# Patient Record
Sex: Male | Born: 1975 | Race: Black or African American | Hispanic: No | Marital: Single | Smoking: Former smoker
Health system: Southern US, Community
[De-identification: ages and names within clinical notes are randomized; demographics above are authoritative.]

## PROBLEM LIST (undated history)

## (undated) DIAGNOSIS — Z789 Other specified health status: Secondary | ICD-10-CM

---

## 2000-07-04 ENCOUNTER — Emergency Department (HOSPITAL_COMMUNITY): Admission: EM | Admit: 2000-07-04 | Discharge: 2000-07-04 | Payer: Self-pay | Admitting: Emergency Medicine

## 2000-07-05 ENCOUNTER — Encounter: Payer: Self-pay | Admitting: Emergency Medicine

## 2000-07-05 ENCOUNTER — Emergency Department (HOSPITAL_COMMUNITY): Admission: EM | Admit: 2000-07-05 | Discharge: 2000-07-05 | Payer: Self-pay | Admitting: Emergency Medicine

## 2000-07-12 ENCOUNTER — Emergency Department (HOSPITAL_COMMUNITY): Admission: EM | Admit: 2000-07-12 | Discharge: 2000-07-12 | Payer: Self-pay | Admitting: *Deleted

## 2006-02-05 ENCOUNTER — Emergency Department (HOSPITAL_COMMUNITY): Admission: EM | Admit: 2006-02-05 | Discharge: 2006-02-05 | Payer: Self-pay | Admitting: Emergency Medicine

## 2013-08-03 ENCOUNTER — Emergency Department (HOSPITAL_COMMUNITY)
Admission: EM | Admit: 2013-08-03 | Discharge: 2013-08-03 | Disposition: A | Discharge status: Home / Self Care | Payer: Self-pay | Attending: Emergency Medicine | Admitting: Emergency Medicine

## 2013-08-03 ENCOUNTER — Encounter (HOSPITAL_COMMUNITY): Payer: Self-pay | Admitting: Emergency Medicine

## 2013-08-03 DIAGNOSIS — F17200 Nicotine dependence, unspecified, uncomplicated: Secondary | ICD-10-CM | POA: Insufficient documentation

## 2013-08-03 DIAGNOSIS — M545 Low back pain, unspecified: Secondary | ICD-10-CM | POA: Insufficient documentation

## 2013-08-03 MED ORDER — CYCLOBENZAPRINE HCL 10 MG PO TABS: 10.0000 mg | ORAL_TABLET | Freq: Three times a day (TID) | ORAL | Status: DC | PRN

## 2013-08-03 NOTE — ED Notes (Signed)
Per pt sts lower back pain for a few days since doing some work. sts taking aleve with some relief.

## 2013-08-03 NOTE — ED Provider Notes (Signed)
CSN: [id number]     Arrival date & time 08/03/13  1217 History  This chart was scribed for non-physician practitioner. Trixie Dredge, PA-C, working with Laray Anger, DO by Shari Heritage, ED Scribe. This patient was seen in room TR06C/TR06C and the patient's care was started at 2:17 PM.    Chief Complaint  Patient presents with  . Back Pain    The history is provided by the patient. No language interpreter was used.     HPI Comments: Nathan Mann is a 37 y.o. male who presents to the Emergency Department complaining of constant lower back pain onset 4 days ago. Pain is exacerbated with certain movements of his legs. Currently, he rates pain as 9/10. Patient states that he was sitting with his back bent in a chair doing spackling work on his baseboards at home when his back "locked up" and he developed pain. He has been taking 2 ibuprofen every 4 hours. He denies numbness or weakness of the extremities, abdominal pain, fever, dysuria, hematuria, urinary incontinence, bowel incontinence, diarrhea, or constipation. He denies a history of cancer. He has no history of IV drug use.    History reviewed. No pertinent past medical history. History reviewed. No pertinent past surgical history. History reviewed. No pertinent family history. History  Substance Use Topics  . Smoking status: Current Every Day Smoker  . Smokeless tobacco: Not on file  . Alcohol Use: No    Review of Systems  Constitutional: Negative for fever.  Gastrointestinal: Negative for abdominal pain, diarrhea and constipation.  Genitourinary: Negative for dysuria, hematuria and difficulty urinating.  Musculoskeletal: Positive for back pain.  Neurological: Negative for weakness and numbness.    Allergies  Review of patient's allergies indicates no known allergies.  Home Medications   Current Outpatient Rx  Name  Route  Sig  Dispense  Refill  . ibuprofen (ADVIL,MOTRIN) 200 MG tablet   Oral   Take 400 mg by  mouth every 6 (six) hours as needed for moderate pain.         . Menthol, Topical Analgesic, (ICY HOT EX)   Apply externally   Apply 1 application topically daily as needed (back pain).          Triage Vitals: BP 137/94  Pulse 96  Temp(Src) 97.9 F (36.6 C) (Oral)  Resp 20  Ht 6\' 2"  (1.88 m)  Wt 230 lb 12.8 oz (104.69 kg)  BMI 29.62 kg/m2  SpO2 98% Physical Exam  Nursing note and vitals reviewed. Constitutional: He appears well-developed and well-nourished. No distress.  HENT:  Head: Normocephalic and atraumatic.  Neck: Neck supple.  Pulmonary/Chest: Effort normal.  Abdominal: Soft. There is no tenderness. There is no rebound and no guarding.  Musculoskeletal:  Spine nontender, no crepitus, or stepoffs. Back nontender. Lower extremities:  Strength 5/5, sensation intact, distal pulses intact.    Neurological: He is alert.  Skin: He is not diaphoretic.    ED Course  Procedures (including critical care time) DIAGNOSTIC STUDIES: Oxygen Saturation is 98% on room air, normal by my interpretation.    COORDINATION OF CARE: 2:20 PM- Patient informed of current plan for treatment and evaluation and agrees with plan at this time.      MDM   1. Low back pain    Pt with persistent low back pain after it "locked up" while he was leaning over in a chair for prolonged period.  Neurovascularly intact.  No red flags.  D/C home with flexeril, continued NSAIDs.  Discussed  findings, treatment, and follow up  with patient.  Pt given return precautions.  Pt verbalizes understanding and agrees with plan.      I personally performed the services described in this documentation, which was scribed in my presence. The recorded information has been reviewed and is accurate.    Trixie Dredge, PA-C 08/03/13 1519

## 2013-08-04 NOTE — ED Provider Notes (Signed)
Medical screening examination/treatment/procedure(s) were performed by non-physician practitioner and as supervising physician I was immediately available for consultation/collaboration.  EKG Interpretation   None         Tin Engram M Malone Vanblarcom, DO 08/04/13 1040 

## 2015-06-26 ENCOUNTER — Encounter (HOSPITAL_COMMUNITY): Payer: Self-pay | Admitting: Emergency Medicine

## 2015-06-26 ENCOUNTER — Emergency Department (HOSPITAL_COMMUNITY)
Admission: EM | Admit: 2015-06-26 | Discharge: 2015-06-27 | Disposition: A | Discharge status: Home / Self Care | Payer: Medicaid Other | Attending: Emergency Medicine | Admitting: Emergency Medicine

## 2015-06-26 DIAGNOSIS — H6502 Acute serous otitis media, left ear: Secondary | ICD-10-CM | POA: Diagnosis not present

## 2015-06-26 DIAGNOSIS — Z720 Tobacco use: Secondary | ICD-10-CM | POA: Diagnosis not present

## 2015-06-26 DIAGNOSIS — H9202 Otalgia, left ear: Secondary | ICD-10-CM | POA: Diagnosis present

## 2015-06-26 DIAGNOSIS — H6092 Unspecified otitis externa, left ear: Secondary | ICD-10-CM | POA: Insufficient documentation

## 2015-06-26 DIAGNOSIS — J029 Acute pharyngitis, unspecified: Secondary | ICD-10-CM | POA: Insufficient documentation

## 2015-06-26 NOTE — ED Notes (Addendum)
Patient with  Left ear pain that started suddenly this evening.  He states that he woke up with it, it is a stabbing sensation.  The pain radiates into his head.

## 2015-06-27 MED ORDER — AMOXICILLIN 500 MG PO CAPS
500.0000 mg | ORAL_CAPSULE | Freq: Three times a day (TID) | ORAL | Status: DC
Start: 2015-06-27 — End: 2016-08-22

## 2015-06-27 MED ORDER — NEOMYCIN-POLYMYXIN-HC 1 % OT SOLN
4.0000 [drp] | Freq: Once | OTIC | Status: AC
  Administered 2015-06-27: 4 [drp] via OTIC
  Filled 2015-06-27: qty 10

## 2015-06-27 MED ORDER — HYDROCODONE-ACETAMINOPHEN 5-325 MG PO TABS: 1.0000 | ORAL_TABLET | Freq: Four times a day (QID) | ORAL | Status: DC | PRN

## 2015-06-27 MED ORDER — PSEUDOEPHEDRINE HCL 30 MG PO TABS: 30.0000 mg | ORAL_TABLET | Freq: Four times a day (QID) | ORAL | Status: DC | PRN

## 2015-06-27 MED ORDER — OXYCODONE-ACETAMINOPHEN 5-325 MG PO TABS
1.0000 | ORAL_TABLET | Freq: Once | ORAL | Status: AC
  Administered 2015-06-27: 1 via ORAL
  Filled 2015-06-27: qty 1

## 2015-06-27 MED ORDER — AMOXICILLIN 500 MG PO CAPS
500.0000 mg | ORAL_CAPSULE | Freq: Once | ORAL | Status: AC
  Administered 2015-06-27: 500 mg via ORAL
  Filled 2015-06-27: qty 1

## 2015-06-27 NOTE — Discharge Instructions (Signed)
Otitis Externa Otitis externa is a germ infection in the outer ear. The outer ear is the area from the eardrum to the outside of the ear. Otitis externa is sometimes called "swimmer's ear." HOME CARE  Put drops in the ear as told by your doctor.  Only take medicine as told by your doctor.  If you have diabetes, your doctor may give you more directions. Follow your doctor's directions.  Keep all doctor visits as told. To avoid another infection:  Keep your ear dry. Use the corner of a towel to dry your ear after swimming or bathing.  Avoid scratching or putting things inside your ear.  Avoid swimming in lakes, dirty water, or pools that use a chemical called chlorine poorly.  You may use ear drops after swimming. Combine equal amounts of white vinegar and alcohol in a bottle. Put 3 or 4 drops in each ear. GET HELP IF:   You have a fever.  Your ear is still red, puffy (swollen), or painful after 3 days.  You still have yellowish-white fluid (pus) coming from the ear after 3 days.  Your redness, puffiness, or pain gets worse.  You have a really bad headache.  You have redness, puffiness, pain, or tenderness behind your ear. MAKE SURE YOU:   Understand these instructions.  Will watch your condition.  Will get help right away if you are not doing well or get worse.   This information is not intended to replace advice given to you by your health care provider. Make sure you discuss any questions you have with your health care provider.   Document Released: 02/16/2008 Document Revised: 09/20/2014 Document Reviewed: 09/16/2011 Elsevier Interactive Patient Education 2016 [employer]  Otitis Media, Adult Otitis media is redness, soreness, and puffiness (swelling) in the space just behind your eardrum (middle ear). It may be caused by allergies or infection. It often happens along with a cold. HOME CARE  Take your medicine as told. Finish it even if you start to feel  better.  Only take over-the-counter or prescription medicines for pain, discomfort, or fever as told by your doctor.  Follow up with your doctor as told. GET HELP IF:  You have otitis media only in one ear, or bleeding from your nose, or both.  You notice a lump on your neck.  You are not getting better in 3-5 days.  You feel worse instead of better. GET HELP RIGHT AWAY IF:   You have pain that is not helped with medicine.  You have puffiness, redness, or pain around your ear.  You get a stiff neck.  You cannot move part of your face (paralysis).  You notice that the bone behind your ear hurts when you touch it. MAKE SURE YOU:   Understand these instructions.  Will watch your condition.  Will get help right away if you are not doing well or get worse.   This information is not intended to replace advice given to you by your health care provider. Make sure you discuss any questions you have with your health care provider.   Document Released: 02/16/2008 Document Revised: 09/20/2014 Document Reviewed: 03/27/2013 Elsevier Interactive Patient Education [employer] Elsevier Inc.

## 2015-06-27 NOTE — ED Provider Notes (Signed)
CSN: [id number]     Arrival date & time 06/26/15  2350 History   First MD Initiated Contact with Patient 06/27/15 0002     Chief Complaint  Patient presents with  . Otalgia     (Consider location/radiation/quality/duration/timing/severity/associated sxs/prior Treatment) Patient is a 39 y.o. male presenting with ear pain. The history is provided by the patient.  Otalgia Location:  Left Quality:  Sharp Severity:  Moderate Onset quality:  Gradual Duration:  2 hours Timing:  Constant Progression:  Worsening Chronicity:  New Relieved by:  None tried Worsened by:  Nothing tried Ineffective treatments:  None tried Associated symptoms: congestion and sore throat     History reviewed. No pertinent past medical history. History reviewed. No pertinent past surgical history. History reviewed. No pertinent family history. Social History  Substance Use Topics  . Smoking status: Current Every Day Smoker  . Smokeless tobacco: None  . Alcohol Use: No    Review of Systems  HENT: Positive for congestion, ear pain and sore throat.   all other systems negative    Allergies  Review of patient's allergies indicates no known allergies.  Home Medications   Prior to Admission medications   Medication Sig Start Date End Date Taking? Authorizing Provider  amoxicillin (AMOXIL) 500 MG capsule Take 1 capsule (500 mg total) by mouth 3 (three) times daily. 06/27/15   Hope Orlene OchM Neese, NP  cyclobenzaprine (FLEXERIL) 10 MG tablet Take 1 tablet (10 mg total) by mouth 3 (three) times daily as needed for muscle spasms. 08/03/13   Trixie DredgeEmily West, PA-C  HYDROcodone-acetaminophen (NORCO) 5-325 MG tablet Take 1 tablet by mouth every 6 (six) hours as needed. 06/27/15   Hope Orlene OchM Neese, NP  ibuprofen (ADVIL,MOTRIN) 200 MG tablet Take 400 mg by mouth every 6 (six) hours as needed for moderate pain.    Historical Provider, MD  Menthol, Topical Analgesic, (ICY HOT EX) Apply 1 application topically daily as needed (back  pain).    Historical Provider, MD  pseudoephedrine (SUDAFED) 30 MG tablet Take 1 tablet (30 mg total) by mouth every 6 (six) hours as needed for congestion. 06/27/15   Hope Orlene OchM Neese, NP   BP 146/95 mmHg  Pulse 66  Temp(Src) 97.7 F (36.5 C) (Oral)  Resp 18  SpO2 95% Physical Exam  Constitutional: He is oriented to person, place, and time. He appears well-developed and well-nourished. No distress.  HENT:  Head: Normocephalic.  Left Ear: There is swelling and tenderness. No mastoid tenderness. Tympanic membrane is erythematous and bulging.  Nose: Rhinorrhea present.  Mouth/Throat: Uvula is midline and mucous membranes are normal. Posterior oropharyngeal erythema present.  Eyes: Conjunctivae and EOM are normal. Pupils are equal, round, and reactive to light.  Neck: Normal range of motion. Neck supple.  Cardiovascular: Normal rate and regular rhythm.   Pulmonary/Chest: Effort normal and breath sounds normal.  Musculoskeletal: Normal range of motion.  Lymphadenopathy:    He has cervical adenopathy.  Neurological: He is alert and oriented to person, place, and time. No cranial nerve deficit.  Skin: Skin is warm and dry.  Psychiatric: He has a normal mood and affect. His behavior is normal.  Nursing note and vitals reviewed.   ED Course  Procedures  Cortisporin Otic Suspension left ear, Pain management, Antibiotics  Discussed with the patient elevated BP he states he saw his doctor recently and was told it was elevated and started on medication but he is not taking it. Encouraged patient to take his medication.  BP 146/95  mmHg  Pulse 66  Temp(Src) 97.7 F (36.5 C) (Oral)  Resp 18  SpO2 95%   MDM  39 y.o. male with left ear pain, sore throat and congestion. Stable for discharge without fever, mastoid tenderness and does not appear toxic. Discussed with the patient clinical findings and plan of care and all questioned fully answered. He will follow up with his PCP or return here if  any problems arise.   Final diagnoses:  Otitis externa, left  Acute serous otitis media of left ear, recurrence not specified       Janne Napoleon, NP 06/27/15 0141  Tomasita Crumble, MD 06/27/15 [phone]

## 2016-08-18 HISTORY — PX: WISDOM TOOTH EXTRACTION: SHX21

## 2016-08-22 ENCOUNTER — Emergency Department (HOSPITAL_COMMUNITY): Payer: Medicaid Other | Admitting: Anesthesiology

## 2016-08-22 ENCOUNTER — Encounter (HOSPITAL_COMMUNITY)
Admission: EM | Disposition: A | Discharge status: Home / Self Care | Payer: Self-pay | Source: Home / Self Care | Attending: Emergency Medicine

## 2016-08-22 ENCOUNTER — Encounter (HOSPITAL_COMMUNITY): Payer: Self-pay | Admitting: Emergency Medicine

## 2016-08-22 ENCOUNTER — Emergency Department (HOSPITAL_COMMUNITY): Payer: Medicaid Other

## 2016-08-22 ENCOUNTER — Observation Stay (HOSPITAL_COMMUNITY)
Admission: EM | Admit: 2016-08-22 | Discharge: 2016-08-23 | Disposition: A | Discharge status: Home / Self Care | Payer: Medicaid Other | Attending: Oral Surgery | Admitting: Oral Surgery

## 2016-08-22 DIAGNOSIS — R590 Localized enlarged lymph nodes: Secondary | ICD-10-CM | POA: Diagnosis not present

## 2016-08-22 DIAGNOSIS — K1379 Other lesions of oral mucosa: Secondary | ICD-10-CM | POA: Diagnosis not present

## 2016-08-22 DIAGNOSIS — F17200 Nicotine dependence, unspecified, uncomplicated: Secondary | ICD-10-CM | POA: Insufficient documentation

## 2016-08-22 DIAGNOSIS — K122 Cellulitis and abscess of mouth: Secondary | ICD-10-CM | POA: Diagnosis present

## 2016-08-22 HISTORY — PX: INCISION AND DRAINAGE ABSCESS: SHX5864

## 2016-08-22 HISTORY — PX: INCISE AND DRAIN ABCESS: PRO64

## 2016-08-22 HISTORY — DX: Other specified health status: Z78.9

## 2016-08-22 LAB — BASIC METABOLIC PANEL
ANION GAP: 15 (ref 5–15)
BUN: 11 mg/dL (ref 6–20)
CALCIUM: 9.4 mg/dL (ref 8.9–10.3)
CO2: 16 mmol/L — AB (ref 22–32)
CREATININE: 1.31 mg/dL — AB (ref 0.61–1.24)
Chloride: 103 mmol/L (ref 101–111)
GFR calc Af Amer: >60 mL/min (ref >60)
GLUCOSE: 110 mg/dL — AB (ref 65–99)
Potassium: 3.1 mmol/L — ABNORMAL LOW (ref 3.5–5.1)
Sodium: 134 mmol/L — ABNORMAL LOW (ref 135–145)

## 2016-08-22 LAB — CBC WITH DIFFERENTIAL/PLATELET
BASOS ABS: 0 10*3/uL (ref 0.0–0.1)
Basophils Relative: 0 %
EOS PCT: 0 %
Eosinophils Absolute: 0 10*3/uL (ref 0.0–0.7)
HEMATOCRIT: 40.6 % (ref 39.0–52.0)
Hemoglobin: 14.9 g/dL (ref 13.0–17.0)
LYMPHS PCT: 7 %
Lymphs Abs: 1.1 10*3/uL (ref 0.7–4.0)
MCH: 31.8 pg (ref 26.0–34.0)
MCHC: 36.7 g/dL — AB (ref 30.0–36.0)
MCV: 86.6 fL (ref 78.0–100.0)
MONO ABS: 1.6 10*3/uL — AB (ref 0.1–1.0)
MONOS PCT: 11 %
NEUTROS ABS: 12.1 10*3/uL — AB (ref 1.7–7.7)
Neutrophils Relative %: 81 %
PLATELETS: 221 10*3/uL (ref 150–400)
RBC: 4.69 MIL/uL (ref 4.22–5.81)
RDW: 12.6 % (ref 11.5–15.5)
WBC: 14.9 10*3/uL — ABNORMAL HIGH (ref 4.0–10.5)

## 2016-08-22 SURGERY — INCISION AND DRAINAGE, ABSCESS
Anesthesia: General | Site: Face | Laterality: Right

## 2016-08-22 MED ORDER — AMPICILLIN-SULBACTAM SODIUM 3 (2-1) G IJ SOLR
3.0000 g | Freq: Four times a day (QID) | INTRAMUSCULAR | Status: DC
  Administered 2016-08-23 (×3): 3 g via INTRAVENOUS
  Filled 2016-08-22 (×6): qty 3

## 2016-08-22 MED ORDER — HYDROMORPHONE HCL 2 MG/ML IJ SOLN
1.0000 mg | Freq: Once | INTRAMUSCULAR | Status: AC
  Administered 2016-08-22: 1 mg via INTRAVENOUS
  Filled 2016-08-22: qty 1

## 2016-08-22 MED ORDER — HYDROMORPHONE HCL 1 MG/ML IJ SOLN
INTRAMUSCULAR | Status: AC
  Filled 2016-08-22: qty 0.5

## 2016-08-22 MED ORDER — PROPOFOL 10 MG/ML IV BOLUS
INTRAVENOUS | Status: AC
Start: 2016-08-22 — End: 2016-08-22
  Filled 2016-08-22: qty 40

## 2016-08-22 MED ORDER — FENTANYL CITRATE (PF) 250 MCG/5ML IJ SOLN
INTRAMUSCULAR | Status: DC | PRN
  Administered 2016-08-22: 100 ug via INTRAVENOUS

## 2016-08-22 MED ORDER — HYDROMORPHONE HCL 2 MG/ML IJ SOLN
INTRAMUSCULAR | Status: AC
  Filled 2016-08-22: qty 1

## 2016-08-22 MED ORDER — LACTATED RINGERS IV SOLN
INTRAVENOUS | Status: DC | PRN
  Administered 2016-08-22: 23:00:00 via INTRAVENOUS

## 2016-08-22 MED ORDER — MIDAZOLAM HCL 5 MG/5ML IJ SOLN
INTRAMUSCULAR | Status: DC | PRN
  Administered 2016-08-22: 2 mg via INTRAVENOUS

## 2016-08-22 MED ORDER — LIDOCAINE HCL (CARDIAC) 20 MG/ML IV SOLN
INTRAVENOUS | Status: DC | PRN
  Administered 2016-08-22: 100 mg via INTRATRACHEAL

## 2016-08-22 MED ORDER — SUCCINYLCHOLINE CHLORIDE 20 MG/ML IJ SOLN
INTRAMUSCULAR | Status: DC | PRN
  Administered 2016-08-22: 120 mg via INTRAVENOUS

## 2016-08-22 MED ORDER — PROMETHAZINE HCL 25 MG/ML IJ SOLN: 6.2500 mg | INTRAMUSCULAR | Status: DC | PRN

## 2016-08-22 MED ORDER — ONDANSETRON HCL 4 MG/2ML IJ SOLN
4.0000 mg | Freq: Once | INTRAMUSCULAR | Status: AC
  Administered 2016-08-22: 4 mg via INTRAVENOUS
  Filled 2016-08-22: qty 2

## 2016-08-22 MED ORDER — MIDAZOLAM HCL 2 MG/2ML IJ SOLN
INTRAMUSCULAR | Status: AC
  Filled 2016-08-22: qty 2

## 2016-08-22 MED ORDER — DEXAMETHASONE SODIUM PHOSPHATE 10 MG/ML IJ SOLN
INTRAMUSCULAR | Status: DC | PRN
  Administered 2016-08-22: 10 mg via INTRAVENOUS

## 2016-08-22 MED ORDER — SODIUM CHLORIDE 0.9 % IV SOLN
Freq: Once | INTRAVENOUS | Status: AC
  Administered 2016-08-22: 21:00:00 via INTRAVENOUS

## 2016-08-22 MED ORDER — FENTANYL CITRATE (PF) 100 MCG/2ML IJ SOLN
INTRAMUSCULAR | Status: AC
  Filled 2016-08-22: qty 2

## 2016-08-22 MED ORDER — BUPIVACAINE-EPINEPHRINE (PF) 0.25% -1:200000 IJ SOLN
INTRAMUSCULAR | Status: DC | PRN
  Administered 2016-08-22: 15 mL via PERINEURAL

## 2016-08-22 MED ORDER — IOPAMIDOL (ISOVUE-300) INJECTION 61%
INTRAVENOUS | Status: AC
  Administered 2016-08-22: 75 mL
  Filled 2016-08-22: qty 75

## 2016-08-22 MED ORDER — ONDANSETRON HCL 4 MG/2ML IJ SOLN
INTRAMUSCULAR | Status: DC | PRN
  Administered 2016-08-22: 4 mg via INTRAVENOUS

## 2016-08-22 MED ORDER — BUPIVACAINE-EPINEPHRINE (PF) 0.25% -1:200000 IJ SOLN
INTRAMUSCULAR | Status: AC
  Filled 2016-08-22: qty 30

## 2016-08-22 MED ORDER — HYDROMORPHONE HCL 1 MG/ML IJ SOLN
0.2500 mg | INTRAMUSCULAR | Status: DC | PRN
  Administered 2016-08-22: 0.5 mg via INTRAVENOUS

## 2016-08-22 MED ORDER — CLINDAMYCIN PHOSPHATE 600 MG/50ML IV SOLN
600.0000 mg | Freq: Once | INTRAVENOUS | Status: AC
  Administered 2016-08-22: 600 mg via INTRAVENOUS
  Filled 2016-08-22: qty 50

## 2016-08-22 MED ORDER — PROPOFOL 10 MG/ML IV BOLUS
INTRAVENOUS | Status: DC | PRN
  Administered 2016-08-22: 180 mg via INTRAVENOUS

## 2016-08-22 MED ORDER — KCL IN DEXTROSE-NACL 10-5-0.45 MEQ/L-%-% IV SOLN
INTRAVENOUS | Status: DC
  Administered 2016-08-23: 01:00:00 via INTRAVENOUS
  Filled 2016-08-22 (×2): qty 1000

## 2016-08-22 SURGICAL SUPPLY — 41 items
BLADE 10 SAFETY STRL DISP (BLADE) IMPLANT
BLADE SURG 15 STRL LF DISP TIS (BLADE) ×1 IMPLANT
BLADE SURG 15 STRL SS (BLADE) ×2
BNDG CONFORM 2 STRL LF (GAUZE/BANDAGES/DRESSINGS) IMPLANT
COVER SURGICAL LIGHT HANDLE (MISCELLANEOUS) ×6 IMPLANT
CRADLE DONUT ADULT HEAD (MISCELLANEOUS) IMPLANT
DECANTER SPIKE VIAL GLASS SM (MISCELLANEOUS) ×3 IMPLANT
DRAIN PENROSE 1/4X12 LTX STRL (WOUND CARE) ×3 IMPLANT
DRSG PAD ABDOMINAL 8X10 ST (GAUZE/BANDAGES/DRESSINGS) IMPLANT
ELECT COATED BLADE 2.86 ST (ELECTRODE) IMPLANT
ELECT REM PT RETURN 9FT ADLT (ELECTROSURGICAL) ×3
ELECTRODE REM PT RTRN 9FT ADLT (ELECTROSURGICAL) ×1 IMPLANT
GAUZE PACKING FOLDED 2  STR (GAUZE/BANDAGES/DRESSINGS) ×2
GAUZE PACKING FOLDED 2 STR (GAUZE/BANDAGES/DRESSINGS) ×1 IMPLANT
GAUZE SPONGE 4X4 12PLY STRL (GAUZE/BANDAGES/DRESSINGS) IMPLANT
GAUZE SPONGE 4X4 16PLY XRAY LF (GAUZE/BANDAGES/DRESSINGS) ×3 IMPLANT
GLOVE BIO SURGEON STRL SZ7.5 (GLOVE) ×3 IMPLANT
GLOVE BIOGEL PI IND STRL 6.5 (GLOVE) ×1 IMPLANT
GLOVE BIOGEL PI INDICATOR 6.5 (GLOVE) ×2
GLOVE SURG SS PI 6.5 STRL IVOR (GLOVE) ×3 IMPLANT
GOWN STRL REUS W/ TWL LRG LVL3 (GOWN DISPOSABLE) ×3 IMPLANT
GOWN STRL REUS W/TWL LRG LVL3 (GOWN DISPOSABLE) ×6
KIT BASIN OR (CUSTOM PROCEDURE TRAY) ×3 IMPLANT
KIT ROOM TURNOVER OR (KITS) ×3 IMPLANT
MARKER SKIN DUAL TIP RULER LAB (MISCELLANEOUS) ×3 IMPLANT
NEEDLE HYPO 25GX1X1/2 BEV (NEEDLE) ×3 IMPLANT
NS IRRIG 1000ML POUR BTL (IV SOLUTION) ×3 IMPLANT
PACK SURGICAL SETUP 50X90 (CUSTOM PROCEDURE TRAY) ×3 IMPLANT
PAD ARMBOARD 7.5X6 YLW CONV (MISCELLANEOUS) ×6 IMPLANT
PENCIL BUTTON HOLSTER BLD 10FT (ELECTRODE) ×3 IMPLANT
SPONGE GAUZE 4X4 12PLY STER LF (GAUZE/BANDAGES/DRESSINGS) ×3 IMPLANT
SUT CHROMIC 4 0 PS 2 18 (SUTURE) ×3 IMPLANT
SUT ETHILON 2 0 FS 18 (SUTURE) IMPLANT
SUT SILK 3 0 SH 30 (SUTURE) ×3 IMPLANT
SWAB COLLECTION DEVICE MRSA (MISCELLANEOUS) ×3 IMPLANT
SYR BULB IRRIGATION 50ML (SYRINGE) IMPLANT
SYR CONTROL 10ML LL (SYRINGE) ×3 IMPLANT
TAPE CLOTH SURG 4X10 WHT LF (GAUZE/BANDAGES/DRESSINGS) ×3 IMPLANT
TUBE ANAEROBIC SPECIMEN COL (MISCELLANEOUS) ×3 IMPLANT
TUBE CONNECTING 12'X1/4 (SUCTIONS) ×1
TUBE CONNECTING 12X1/4 (SUCTIONS) ×2 IMPLANT

## 2016-08-22 NOTE — H&P (Signed)
HISTORY AND PHYSICAL  Nathan Mann is a 40 y.o. male patient s/p removal impacted and non-restorable teeth 4 days ago. Began having difficulty swallowing 1 day ago. Felt like couldn't breathe when laying on right jaw. Started Amoxicillin today.   No diagnosis found.  History reviewed. No pertinent past medical history.  No current facility-administered medications for this encounter.    Current Outpatient Prescriptions  Medication Sig Dispense Refill  . amoxicillin (AMOXIL) 500 MG capsule Take 1 capsule (500 mg total) by mouth 3 (three) times daily. 30 capsule 0  . ibuprofen (ADVIL,MOTRIN) 200 MG tablet Take 400 mg by mouth every 6 (six) hours as needed for moderate pain.    Marland Kitchen. oxyCODONE-acetaminophen (PERCOCET/ROXICET) 5-325 MG tablet Take 1 tablet by mouth every 4 (four) hours as needed for moderate pain or severe pain.    . cyclobenzaprine (FLEXERIL) 10 MG tablet Take 1 tablet (10 mg total) by mouth 3 (three) times daily as needed for muscle spasms. (Patient not taking: Reported on 08/22/2016) 15 tablet 0  . HYDROcodone-acetaminophen (NORCO) 5-325 MG tablet Take 1 tablet by mouth every 6 (six) hours as needed. 10 tablet 0  . pseudoephedrine (SUDAFED) 30 MG tablet Take 1 tablet (30 mg total) by mouth every 6 (six) hours as needed for congestion. (Patient not taking: Reported on 08/22/2016) 20 tablet 0   No Known Allergies Active Problems:   * No active hospital problems. *  Vitals: Blood pressure 147/99, pulse 91, temperature 100.2 F (37.9 C), temperature source Axillary, resp. rate 13, height 6\' 2"  (1.88 m), weight 90.7 kg (200 lb), SpO2 99 %. Lab results: Results for orders placed or performed during the hospital encounter of 08/22/16 (from the past 24 hour(s))  CBC with Differential     Status: Abnormal   Collection Time: 08/22/16  7:05 PM  Result Value Ref Range   WBC 14.9 (H) 4.0 - 10.5 K/uL   RBC 4.69 4.22 - 5.81 MIL/uL   Hemoglobin 14.9 13.0 - 17.0 g/dL   HCT 16.140.6 09.639.0  - 04.552.0 %   MCV 86.6 78.0 - 100.0 fL   MCH 31.8 26.0 - 34.0 pg   MCHC 36.7 (H) 30.0 - 36.0 g/dL   RDW 40.912.6 81.111.5 - 91.415.5 %   Platelets 221 150 - 400 K/uL   Neutrophils Relative % 81 %   Neutro Abs 12.1 (H) 1.7 - 7.7 K/uL   Lymphocytes Relative 7 %   Lymphs Abs 1.1 0.7 - 4.0 K/uL   Monocytes Relative 11 %   Monocytes Absolute 1.6 (H) 0.1 - 1.0 K/uL   Eosinophils Relative 0 %   Eosinophils Absolute 0.0 0.0 - 0.7 K/uL   Basophils Relative 0 %   Basophils Absolute 0.0 0.0 - 0.1 K/uL  Basic metabolic panel     Status: Abnormal   Collection Time: 08/22/16  7:05 PM  Result Value Ref Range   Sodium 134 (L) 135 - 145 mmol/L   Potassium 3.1 (L) 3.5 - 5.1 mmol/L   Chloride 103 101 - 111 mmol/L   CO2 16 (L) 22 - 32 mmol/L   Glucose, Bld 110 (H) 65 - 99 mg/dL   BUN 11 6 - 20 mg/dL   Creatinine, Ser 7.821.31 (H) 0.61 - 1.24 mg/dL   Calcium 9.4 8.9 - 95.610.3 mg/dL   GFR calc non Af Amer >60 >60 mL/min   GFR calc Af Amer >60 >60 mL/min   Anion gap 15 5 - 15   Radiology Results: Ct Soft Tissue Neck  W Contrast  Result Date: 08/22/2016 CLINICAL DATA:  Right-sided neck and jaw pain and swelling since wisdom tooth extraction last week. EXAM: CT NECK WITH CONTRAST TECHNIQUE: Multidetector CT imaging of the neck was performed using the standard protocol following the bolus administration of intravenous contrast. CONTRAST:  75mL ISOVUE-300 IOPAMIDOL (ISOVUE-300) INJECTION 61% COMPARISON:  None. FINDINGS: Pharynx and larynx: Sequelae of recent bilateral maxillary and mandibular wisdom teeth extractions are identified. Medial to the right mandibular extraction site is a 3.5 x 3.3 x 4.5 cm gas and fluid collection consistent with abscess involving the posterior submandibular space. There is mass effect and edema involving the right tongue base and lateral right oropharynx. The airway remains patent. Salivary glands: Anteroinferior displacement of the right submandibular gland by the adjacent abscess with fluid  insinuating in the gland. Inflammation and unorganized fluid throughout the right submandibular space. The subcutaneous fat infiltration in the right submandibular region extending into the anterior right upper neck. Unremarkable parotid glands and left submandibular gland. Thyroid: Unremarkable. Lymph nodes: Reactive right upper cervical lymphadenopathy including an 8 mm short axis right submandibular lymph node and a 14 mm short axis level IIA lymph node. Vascular: Major vascular structures of the neck appear patent. Limited intracranial: Unremarkable. Visualized orbits: Unremarkable. Mastoids and visualized paranasal sinuses: Clear. Skeleton: Mild disc degeneration at C5-6. Moderate left facet arthrosis at C4-5. Upper chest: Clear lung apices. Other: None. IMPRESSION: 1. 4.5 cm posterior right submandibular space abscess. 2. Reactive right-sided cervical lymphadenopathy. Electronically Signed   By: Sebastian AcheAllen  Grady M.D.   On: 08/22/2016 20:30   General appearance: alert, cooperative and mild distress Head: Normocephalic, without obvious abnormality, atraumatic Eyes: conjunctivae/corneas clear. PERRL, EOM's intact. Fundi benign. Nose: Nares normal. Septum midline. Mucosa normal. No drainage or sinus tenderness. Throat: Trismus to 10 mm (secondary to pain), No oral purulence or fluctuance. Right FOM mildly elevated posteriorly. Neck: moderate right submandibular swelling with tenderness.  Resp: clear to auscultation bilaterally Cardio: regular rate and rhythm, S1, S2 normal, no murmur, click, rub or gallop  Assessment: Right submandibular/sublingual space abscess.  Plan: Incision and drainage. GA. 23h obs pending patient response   Nathan Mann M 08/22/2016

## 2016-08-22 NOTE — Op Note (Signed)
08/22/2016  11:26 PM  PATIENT:  Nathan Mann  40 y.o. male  PRE-OPERATIVE DIAGNOSIS:  Right submandibular/sublingual  abscess  POST-OPERATIVE DIAGNOSIS:  SAME   PROCEDURE:  Procedure(s): INCISION AND DRAINAGE RIGHT SUBMANDIBULAR/SUBLINGUAL ABSCESS, palatal Uvulectomy SURGEON:  Surgeon(s): Ocie DoyneScott Jancie Kercher, DDS  ANESTHESIA:   local and general  EBL:  minimal  DRAINS: none   SPECIMEN:  No Specimen  COUNTS:  YES  PLAN OF CARE: Discharge to home after PACU  PATIENT DISPOSITION:  PACU - hemodynamically stable.   PROCEDURE DETAILS: Dictation #   Georgia LopesScott M. Kevonna Nolte, DMD 08/22/2016 11:26 PM

## 2016-08-22 NOTE — Anesthesia Preprocedure Evaluation (Addendum)
Anesthesia Evaluation  Patient identified by MRN, date of birth, ID band Patient awake    Reviewed: Allergy & Precautions, NPO status , Patient's Chart, lab work & pertinent test results  Airway Mallampati: II  TM Distance: >3 FB Neck ROM: Full    Dental no notable dental hx. (+) Dental Advisory Given   Pulmonary Current Smoker,    Pulmonary exam normal breath sounds clear to auscultation       Cardiovascular negative cardio ROS Normal cardiovascular exam Rhythm:Regular Rate:Normal     Neuro/Psych negative neurological ROS  negative psych ROS   GI/Hepatic negative GI ROS, Neg liver ROS,   Endo/Other  negative endocrine ROS  Renal/GU negative Renal ROS  negative genitourinary   Musculoskeletal negative musculoskeletal ROS (+)   Abdominal   Peds negative pediatric ROS (+)  Hematology negative hematology ROS (+)   Anesthesia Other Findings   Reproductive/Obstetrics negative OB ROS                            Anesthesia Physical Anesthesia Plan  ASA: II and emergent  Anesthesia Plan: General   Post-op Pain Management:    Induction: Intravenous and Rapid sequence  Airway Management Planned: Oral ETT  Additional Equipment:   Intra-op Plan:   Post-operative Plan: Extubation in OR  Informed Consent: I have reviewed the patients History and Physical, chart, labs and discussed the procedure including the risks, benefits and alternatives for the proposed anesthesia with the patient or authorized representative who has indicated his/her understanding and acceptance.   Dental advisory given  Plan Discussed with: CRNA, Surgeon and Anesthesiologist  Anesthesia Plan Comments:        Anesthesia Quick Evaluation

## 2016-08-22 NOTE — Anesthesia Procedure Notes (Signed)
Procedure Name: Intubation Date/Time: 08/22/2016 10:54 PM Performed by: Brien MatesMAHONY, Amberlie Gaillard D Pre-anesthesia Checklist: Patient identified, Emergency Drugs available, Suction available, Patient being monitored and Timeout performed Patient Re-evaluated:Patient Re-evaluated prior to inductionOxygen Delivery Method: Circle system utilized Preoxygenation: Pre-oxygenation with 100% oxygen Intubation Type: IV induction and Cricoid Pressure applied Ventilation: Mask ventilation without difficulty Laryngoscope size: Elective Glidescope. Grade View: Grade I Tube type: Oral Tube size: 7.5 mm Number of attempts: 1 Airway Equipment and Method: Stylet (Elective Glidescope used due to limited mouth opening.  Easy intubation despite small mouth opening. Upon removing Glidescope blade, it was noted that the ETT had pierced a section of the right tonsilar pillar.  [facility] Placement Confirmation: ETT inserted through vocal cords under direct vision,  positive ETCO2 and breath sounds checked- equal and bilateral Secured at: 22 cm Tube secured with: Tape (22) Dental Injury: Bloody posterior oropharynx

## 2016-08-22 NOTE — Transfer of Care (Signed)
Immediate Anesthesia Transfer of Care Note  Patient: Nathan Mann  Procedure(s) Performed: Procedure(s): INCISION AND DRAINAGE ABSCESS (Right)  Patient Location: PACU  Anesthesia Type:General  Level of Consciousness: awake  Airway & Oxygen Therapy: Patient Spontanous Breathing  Post-op Assessment: Report given to RN and Post -op Vital signs reviewed and stable  Post vital signs: Reviewed and stable  Last Vitals:  Vitals:   08/22/16 2045 08/22/16 2345  BP: 147/99   Pulse: 91   Resp: 13   Temp:  36.4 C    Last Pain:  Vitals:   08/22/16 2114  TempSrc:   PainSc: 10-Worst pain ever         Complications: No apparent anesthesia complications

## 2016-08-22 NOTE — ED Provider Notes (Signed)
[facility]-EMERGENCY DEPT Provider Note   CSN: [id number] Arrival date & time: 08/22/16  1828     History   Chief Complaint Chief Complaint  Patient presents with  . Oral Swelling    HPI Nathan Mann is a 40 y.o. male presents with R sided neck swelling, severe pain, difficulty controlling oral secretions, fever, nausea and vomiting x 4 days.  Pt had four wisdom teeth extracted by Dr. Barbette MerinoJensen (oral surgeon) on 08/18/2016.  Pt was discharged with percocet.  Pt noticed increased pain and swelling the morning after the extractions, symptoms have since worsened.  Pt states he occasionally can taste something draining out of his extraction sites, foul smelling.   HPI  Past Medical History:  Diagnosis Date  . Medical history non-contributory     Patient Active Problem List   Diagnosis Date Noted  . 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 INCISE AND DRAIN ABCESS  08/22/2016    Right submandibular sublingual space abscess.  . INCISION AND DRAINAGE ABSCESS Right 08/22/2016   Procedure: INCISION AND DRAINAGE RIGHT MANDIBLE ABSCESS;  Surgeon: Ocie DoyneScott Jensen, DDS;  Location: [facility] OR;  Service: Oral Surgery;  Laterality: Right;  . WISDOM TOOTH EXTRACTION  08/18/2016       Home Medications    Prior to Admission medications   Medication Sig Start Date End Date Taking? Authorizing Provider  ibuprofen (ADVIL,MOTRIN) 200 MG tablet Take 400 mg by mouth every 6 (six) hours as needed for moderate pain.   Yes Historical Provider, MD  oxyCODONE-acetaminophen (PERCOCET/ROXICET) 5-325 MG tablet Take 1 tablet by mouth every 4 (four) hours as needed for moderate pain or severe pain.   Yes Historical Provider, MD  amoxicillin-clavulanate (AUGMENTIN) 875-125 MG tablet Take 1 tablet by mouth 2 (two) times daily. 08/23/16   Ocie DoyneScott Jensen, DDS  cyclobenzaprine (FLEXERIL) 10 MG tablet Take 1 tablet (10 mg total) by mouth 3 (three) times daily as  needed for muscle spasms. Patient not taking: Reported on 08/22/2016 08/03/13   Trixie DredgeEmily West, PA-C  oxyCODONE-acetaminophen (PERCOCET) 10-325 MG tablet Take 1-2 tablets by mouth every 4 (four) hours as needed for pain. 08/23/16   Ocie DoyneScott Jensen, DDS    Family History History reviewed. No pertinent family history.  Social History Social History  Substance Use Topics  . Smoking status: Former Smoker    Types: Cigarettes    Quit date: 08/18/2016  . Smokeless tobacco: Never Used  . Alcohol use No     Allergies   Patient has no known allergies.   Review of Systems Review of Systems  Constitutional: Positive for chills and fever.  HENT: Positive for dental problem, drooling, ear pain, facial swelling, trouble swallowing and voice change.   Eyes: Negative for pain.  Respiratory: Negative for cough, choking and shortness of breath.   Cardiovascular: Negative for chest pain.  Gastrointestinal: Positive for nausea. Negative for abdominal pain.  Musculoskeletal: Positive for neck pain.  Neurological: Positive for headaches.     Physical Exam Updated Vital Signs BP 130/79 (BP Location: Left Arm)   Pulse 71   Temp 97.7 F (36.5 C) (Oral)   Resp 18   Ht 6\' 2"  (1.88 m)   Wt 91.1 kg   SpO2 98%   BMI 25.79 kg/m   Physical Exam  Constitutional: He is oriented to person, place, and time. Vital signs are normal. He appears well-developed and well-nourished. He appears distressed (pt shaking in bed, states pain is severe).  HENT:  Head: Normocephalic and atraumatic.  Right Ear: External ear normal.  Left Ear: External ear normal.  Trismus, pt unable to open mouth to evaluate oropharynx, extraction sites or tongue.  No sublingual swelling palpated.  Pt spitting up oral secretions.   Eyes: EOM are normal. Pupils are equal, round, and reactive to light.  Neck: Normal range of motion.  Obvious, moderate R anterior neck tenderness and induration, exquisitely tender. No stridor.    Cardiovascular: Normal rate.   Pulmonary/Chest: Effort normal and breath sounds normal. He has no wheezes.  Abdominal: There is no tenderness.  Musculoskeletal: Normal range of motion.  Neurological: He is alert and oriented to person, place, and time.  Skin: Skin is warm and dry. Capillary refill takes less than 2 seconds.  Psychiatric: He has a normal mood and affect. His behavior is normal.  Anxious appearing.   Nursing note and vitals reviewed.    ED Treatments / Results  Labs (all labs ordered are listed, but only abnormal results are displayed) Labs Reviewed  CBC WITH DIFFERENTIAL/PLATELET - Abnormal; Notable for the following:       Result Value   WBC 14.9 (*)    MCHC 36.7 (*)    Neutro Abs 12.1 (*)    Monocytes Absolute 1.6 (*)    All other components within normal limits  BASIC METABOLIC PANEL - Abnormal; Notable for the following:    Sodium 134 (*)    Potassium 3.1 (*)    CO2 16 (*)    Glucose, Bld 110 (*)    Creatinine, Ser 1.31 (*)    All other components within normal limits  CBC - Abnormal; Notable for the following:    WBC 14.4 (*)    All other components within normal limits  AEROBIC/ANAEROBIC CULTURE (SURGICAL/DEEP WOUND)    EKG  EKG Interpretation None       Radiology No results found.  Procedures Procedures (including critical care time)  Medications Ordered in ED Medications  HYDROmorphone (DILAUDID) 1 MG/ML injection (not administered)  HYDROmorphone (DILAUDID) 2 MG/ML injection (not administered)  HYDROmorphone (DILAUDID) injection 1 mg (1 mg Intravenous Given 08/22/16 1900)  clindamycin (CLEOCIN) IVPB 600 mg (0 mg Intravenous Stopped 08/22/16 2123)  0.9 %  sodium chloride infusion ( Intravenous New Bag/Given 08/22/16 2036)  iopamidol (ISOVUE-300) 61 % injection (75 mLs  Contrast Given 08/22/16 2002)  HYDROmorphone (DILAUDID) injection 1 mg (1 mg Intravenous Given 08/22/16 2126)  ondansetron (ZOFRAN) injection 4 mg (4 mg Intravenous  Given 08/22/16 2126)     Initial Impression / Assessment and Plan / ED Course  I have reviewed the triage vital signs and the nursing notes.  Pertinent labs & imaging results that were available during my care of the patient were reviewed by me and considered in my medical decision making (see chart for details).  Clinical Course    40 yo male s/p extraction of wisdom teeth 4 days ago by oral surgeon (dr. Barbette MerinoJensen), discharged with percocet for pain.  Pt presents to ED with R sided neck swelling, severe pain, difficulty controlling oral secretions, fever, nausea and vomiting x 4 days  On exam pt is febrile, tachycardic and tachypnic.  There is obvious R submandibular induration, swelling and tenderness on exam.  Pt is anxious appearing reporting severe pain causing nausea.  Pt spitting oral secretions into a bag, however speaking in full sentences with hot potato voice, in no respiratory distress, no stridor.    CT neck remarkable for 4.5 cm posterior  right submandibular space abscess with patent airway.  Pt given dilaudid, zofran, fluids, clindamycin in ED. On re-evaluation pt appeared less anxious and reported decrease in pain with dilaudid.    ENT consulted, recommended call Dr. Barbette Merino (oral surgeon). Pt discussed with Dr. Adela Lank who assisted in oral surgeon consult.  Dr. Barbette Merino consulted, will evaluate pt in Ed.  Pt likely to be taken to OR for I&D.    Final Clinical Impressions(s) / ED Diagnoses   Final diagnoses:  Submandibular abscess    New Prescriptions Discharge Medication List as of 08/23/2016 12:30 PM    START taking these medications   Details  amoxicillin-clavulanate (AUGMENTIN) 875-125 MG tablet Take 1 tablet by mouth 2 (two) times daily., Starting Mon 08/23/2016, Normal    oxyCODONE-acetaminophen (PERCOCET) 10-325 MG tablet Take 1-2 tablets by mouth every 4 (four) hours as needed for pain., Starting Mon 08/23/2016, Print         Liberty Handy, PA-C 08/24/16  2309    Melene Plan, DO 08/24/16 2357

## 2016-08-22 NOTE — ED Triage Notes (Signed)
Pt arrived via EMS from home. Pt reports having "all four wisdom teeth removed 4 days ago." Pt reports swelling in mouth and neck; pt reports headache, chills, and pain 10/10; emesis since yesterday. Pt states "I can't get anything down, it gets stuck in my throat." Resp e/u; diaphoretic and agitation on assessment. Pt speaking full sentences. A&Ox4

## 2016-08-23 ENCOUNTER — Encounter (HOSPITAL_COMMUNITY): Payer: Self-pay | Admitting: General Practice

## 2016-08-23 LAB — CBC
HEMATOCRIT: 39.5 % (ref 39.0–52.0)
Hemoglobin: 14.1 g/dL (ref 13.0–17.0)
MCH: 31.3 pg (ref 26.0–34.0)
MCHC: 35.7 g/dL (ref 30.0–36.0)
MCV: 87.6 fL (ref 78.0–100.0)
PLATELETS: 237 10*3/uL (ref 150–400)
RBC: 4.51 MIL/uL (ref 4.22–5.81)
RDW: 12.9 % (ref 11.5–15.5)
WBC: 14.4 10*3/uL — AB (ref 4.0–10.5)

## 2016-08-23 MED ORDER — ACETAMINOPHEN 325 MG PO TABS: 650.0000 mg | ORAL_TABLET | Freq: Four times a day (QID) | ORAL | Status: DC | PRN

## 2016-08-23 MED ORDER — HYDROMORPHONE HCL 2 MG/ML IJ SOLN
0.5000 mg | INTRAMUSCULAR | Status: DC | PRN
  Administered 2016-08-23: 0.5 mg via INTRAVENOUS
  Filled 2016-08-23: qty 1

## 2016-08-23 MED ORDER — ACETAMINOPHEN 500 MG PO TABS: 1000.0000 mg | ORAL_TABLET | Freq: Four times a day (QID) | ORAL | Status: DC

## 2016-08-23 MED ORDER — OXYCODONE-ACETAMINOPHEN 5-325 MG PO TABS
1.0000 | ORAL_TABLET | ORAL | Status: DC | PRN
  Administered 2016-08-23 (×2): 2 via ORAL
  Filled 2016-08-23 (×2): qty 2

## 2016-08-23 MED ORDER — DIPHENHYDRAMINE HCL 25 MG PO CAPS: 25.0000 mg | ORAL_CAPSULE | Freq: Four times a day (QID) | ORAL | Status: DC | PRN

## 2016-08-23 MED ORDER — ONDANSETRON HCL 4 MG/2ML IJ SOLN: 4.0000 mg | Freq: Four times a day (QID) | INTRAMUSCULAR | Status: DC | PRN

## 2016-08-23 MED ORDER — ONDANSETRON 4 MG PO TBDP: 4.0000 mg | ORAL_TABLET | Freq: Four times a day (QID) | ORAL | Status: DC | PRN

## 2016-08-23 MED ORDER — IBUPROFEN 400 MG PO TABS: 400.0000 mg | ORAL_TABLET | Freq: Four times a day (QID) | ORAL | Status: DC | PRN

## 2016-08-23 MED ORDER — AMOXICILLIN-POT CLAVULANATE 875-125 MG PO TABS
1.0000 | ORAL_TABLET | Freq: Two times a day (BID) | ORAL | 0 refills | Status: AC
Start: 2016-08-23 — End: ?

## 2016-08-23 MED ORDER — DIPHENHYDRAMINE HCL 50 MG/ML IJ SOLN
25.0000 mg | Freq: Four times a day (QID) | INTRAMUSCULAR | Status: DC | PRN
  Administered 2016-08-23: 25 mg via INTRAVENOUS
  Filled 2016-08-23: qty 1

## 2016-08-23 MED ORDER — OXYCODONE-ACETAMINOPHEN 10-325 MG PO TABS: 1.0000 | ORAL_TABLET | ORAL | 0 refills | Status: AC | PRN

## 2016-08-23 MED ORDER — OXYCODONE-ACETAMINOPHEN 5-325 MG PO TABS: 1.0000 | ORAL_TABLET | ORAL | Status: DC | PRN

## 2016-08-23 MED ORDER — ACETAMINOPHEN 650 MG RE SUPP: 650.0000 mg | Freq: Four times a day (QID) | RECTAL | Status: DC | PRN

## 2016-08-23 MED ORDER — HYDRALAZINE HCL 20 MG/ML IJ SOLN: 10.0000 mg | INTRAMUSCULAR | Status: DC | PRN

## 2016-08-23 MED ORDER — CYCLOBENZAPRINE HCL 10 MG PO TABS: 10.0000 mg | ORAL_TABLET | Freq: Three times a day (TID) | ORAL | Status: DC | PRN

## 2016-08-23 NOTE — Progress Notes (Signed)
Discharge home. Home discharge instruction given, no question verbalized. 

## 2016-08-23 NOTE — Op Note (Signed)
NAME:  Rivka SpringLENNON, Petra                  ACCOUNT NO.:  MEDICAL RECORD NO.:  [id number]  LOCATION:                                 FACILITY:  PHYSICIAN:  Georgia LopesScott M. Cyra Spader, M.D.       DATE OF BIRTH:  DATE OF PROCEDURE:  08/22/2016 DATE OF DISCHARGE:                              OPERATIVE REPORT   PREOPERATIVE DIAGNOSIS:  Right submandibular sublingual space abscess.  POSTOPERATIVE DIAGNOSIS:  Right submandibular sublingual space abscess.  PROCEDURE:  Incision and drainage of right submandibular and sublingual abscess, palatal uvulectomy.  SURGEON:  Georgia LopesScott M. Jomar Denz, M.D.  ANESTHESIA:  General, oral intubation.  DESCRIPTION OF PROCEDURE:  The patient was taken to the operating room and placed on the table in supine position.  General anesthesia was administered and an oral endotracheal tube was placed.  It was noted at the time of placement of the tube, it appeared to pierce the soft tissue of the palate, however intubation was successful.  The patient was prepped and draped for the procedure and time-out was performed.  Then, the mandible was palpated and local anesthetic with 0.25% Marcaine with 1:200,000 epinephrine was infiltrated submandibularly on the right side, approximately 2 cm below the inferior border of the mandible over the maximum area of induration and swelling.  Then a 15-blade was used to make approximately 1 cm incision through the skin.  Hemostats were used to dissect superiorly through the swelling until the inferior border of the mandible was found and then dissection was carried into the sublingual space.  Copious amount of purulent exudate was encountered. Cultures, aerobic and anaerobic and Gram stain were taken.  The hemostat was then spread in to open any loculated areas and then the area was irrigated with normal saline.  Then, a quarter-inch Penrose drain was placed and fastened to the skin with 3-0 silk.  Then, the oral cavity was inspected and  suctioned and there was indeed a laceration that was in the midline of the soft palate which partially perforated the uvula. The patient was then extubated and reintubated so as not to pierce the soft tissue.  The soft tissue was then examined after the tube was secured and it was determined that the best course of action would be to perform a partial uvulectomy in order to help avoid severe postoperative swelling of the uvula.  This was done with the Bovie electrocautery in the cutting mode.  Then, the remainder of the uvula that existed was sutured with 3-0 chromic gut.  Then the patient was turned over the Anesthesia Services for extubation and transportation to recovery room.  ESTIMATED BLOOD LOSS:  Minimal.  COMPLICATIONS:  Lacerated soft palate secondary to difficulty intubation.  SPECIMENS:  None.  COUNTS:  Correct.     Georgia LopesScott M. Eloyce Bultman, M.D.     SMJ/MEDQ  D:  08/22/2016  T:  08/23/2016  Job:  [id number]

## 2016-08-23 NOTE — Discharge Summary (Signed)
Patient ID: Nathan Mann MRN: [id number] DOB/AGE: [birth year] 40 y.o.  Admit date: 08/22/2016 Discharge date: 08/23/2016  Admission Diagnoses: right submandibular and sublingual space abscess  Discharge Diagnoses:  Active Problems:   Submandibular space infection   Discharged Condition: good  Hospital Course: Admitted 08/22/2016 after incision and drainage of right submandibular/sublingual space abscess for IV antibiotics, fluids,  and IV pain management.   Consults: None  Significant Diagnostic Studies: labs: microbiology, hematology  Treatments: surgery: Incision and drainage right submandibular and sublingual abscess.  Discharge Exam: Blood pressure 130/79, pulse 71, temperature 97.7 F (36.5 C), temperature source Oral, resp. rate 18, height 6\' 2"  (1.88 m), weight 91.1 kg (200 lb 13.4 oz), SpO2 98 %. General appearance: alert, cooperative and no distress Head: Normocephalic, without obvious abnormality, atraumatic Eyes: negative Nose: Nares normal. Septum midline. Mucosa normal. No drainage or sinus tenderness. Throat: lips, mucosa, and tongue normal; teeth and gums normal and sutures intact. Pharynx clear. minimal edema orally. MIO 20mm Neck: supple, symmetrical, trachea midline and drain intact. mild drainage  Resp: clear to auscultation bilaterally Cardio: regular rate and rhythm, S1, S2 normal, no murmur, click, rub or gallop  Disposition: 01-Home or Self Care  Discharge Instructions    Call MD for:  difficulty breathing, headache or visual disturbances    Complete by:  As directed    Call MD for:  persistant nausea and vomiting    Complete by:  As directed    Call MD for:  severe uncontrolled pain    Complete by:  As directed    Call MD for:  temperature >100.4    Complete by:  As directed    Diet - low sodium heart healthy    Complete by:  As directed    Soft Diet. Advance as tolerated.   Discharge instructions    Complete by:  As directed    Warm  compresses to jaw.  Warm salt water rinses 5-6 times per day. To office 08/24/2016 for evaluation. Call [phone] for appointment time.   Increase activity slowly    Complete by:  As directed        Medication List    TAKE these medications   amoxicillin-clavulanate 875-125 MG tablet Commonly known as:  AUGMENTIN Take 1 tablet by mouth 2 (two) times daily.   cyclobenzaprine 10 MG tablet Commonly known as:  FLEXERIL Take 1 tablet (10 mg total) by mouth 3 (three) times daily as needed for muscle spasms.   ibuprofen 200 MG tablet Commonly known as:  ADVIL,MOTRIN Take 400 mg by mouth every 6 (six) hours as needed for moderate pain.   oxyCODONE-acetaminophen 5-325 MG tablet Commonly known as:  PERCOCET/ROXICET Take 1 tablet by mouth every 4 (four) hours as needed for moderate pain or severe pain. What changed:  Another medication with the same name was added. Make sure you understand how and when to take each.   oxyCODONE-acetaminophen 10-325 MG tablet Commonly known as:  PERCOCET Take 1-2 tablets by mouth every 4 (four) hours as needed for pain. What changed:  You were already taking a medication with the same name, and this prescription was added. Make sure you understand how and when to take each.        SignedGeorgia Lopes: Jarom Govan M 08/23/2016, 12:29 PM

## 2016-08-23 NOTE — Progress Notes (Signed)
Received patient from PACU with dressing and ice pack aaround neck. Patient AOx4, pain at 5/10, VS stable, O2Sat at 100% on 2L/min via [place]. CNA oriented patient to room, bed controls and call light.  Family members at bedside.  Will administer PRN pain medication percocet as ordered.

## 2016-08-24 NOTE — Anesthesia Postprocedure Evaluation (Signed)
Anesthesia Post Note  Patient: Nathan Mann  Procedure(s) Performed: Procedure(s) (LRB): INCISION AND DRAINAGE RIGHT MANDIBLE ABSCESS (Right)  Patient location during evaluation: PACU Anesthesia Type: General Level of consciousness: awake and alert Pain management: pain level controlled Vital Signs Assessment: post-procedure vital signs reviewed and stable Respiratory status: spontaneous breathing, nonlabored ventilation, respiratory function stable and patient connected to nasal cannula oxygen Cardiovascular status: blood pressure returned to baseline and stable Postop Assessment: no signs of nausea or vomiting Anesthetic complications: no    Last Vitals:  Vitals:   08/23/16 0534 08/23/16 1058  BP: 132/88 130/79  Pulse: 60 71  Resp: 18 18  Temp: 36.7 C 36.5 C    Last Pain:  Vitals:   08/23/16 1058  TempSrc: Oral  PainSc:                  Micha Erck S

## 2016-08-26 NOTE — Progress Notes (Signed)
Left voice message at front desk to make MD aware that pt stated that he can't arrive at 6:45 A.M. as instructed; awaiting a return call.

## 2016-08-26 NOTE — Progress Notes (Signed)
Pt stated that he will call Dr. Randa EvensJensen's cell phone because he will not be able to arrive at 6:45 A.M. as instructed; pt stated that he will call back after he speaks with the MD.

## 2016-08-27 ENCOUNTER — Ambulatory Visit (HOSPITAL_COMMUNITY)
Admission: RE | Admit: 2016-08-27 | Discharge: 2016-08-27 | Disposition: A | Discharge status: Home / Self Care | Payer: Medicaid Other | Source: Ambulatory Visit | Attending: Oral Surgery | Admitting: Oral Surgery

## 2016-08-27 ENCOUNTER — Encounter (HOSPITAL_COMMUNITY)
Admission: RE | Disposition: A | Discharge status: Home / Self Care | Payer: Self-pay | Source: Ambulatory Visit | Attending: Oral Surgery

## 2016-08-27 DIAGNOSIS — Z4803 Encounter for change or removal of drains: Secondary | ICD-10-CM | POA: Diagnosis present

## 2016-08-27 HISTORY — PX: INCISION AND DRAINAGE ABSCESS: SHX5864

## 2016-08-27 SURGERY — INCISION AND DRAINAGE, ABSCESS
Anesthesia: General | Laterality: Right

## 2016-08-27 SURGICAL SUPPLY — 30 items
BLADE 10 SAFETY STRL DISP (BLADE) ×3 IMPLANT
BLADE SURG 15 STRL LF DISP TIS (BLADE) ×1 IMPLANT
BLADE SURG 15 STRL SS (BLADE) ×2
BNDG CONFORM 2 STRL LF (GAUZE/BANDAGES/DRESSINGS) ×3 IMPLANT
COVER SURGICAL LIGHT HANDLE (MISCELLANEOUS) ×6 IMPLANT
CRADLE DONUT ADULT HEAD (MISCELLANEOUS) IMPLANT
DRAIN PENROSE 1/4X12 LTX STRL (WOUND CARE) ×3 IMPLANT
DRSG PAD ABDOMINAL 8X10 ST (GAUZE/BANDAGES/DRESSINGS) IMPLANT
ELECT COATED BLADE 2.86 ST (ELECTRODE) IMPLANT
ELECT REM PT RETURN 9FT ADLT (ELECTROSURGICAL) ×3
ELECTRODE REM PT RTRN 9FT ADLT (ELECTROSURGICAL) ×1 IMPLANT
GAUZE SPONGE 4X4 12PLY STRL (GAUZE/BANDAGES/DRESSINGS) IMPLANT
GAUZE SPONGE 4X4 16PLY XRAY LF (GAUZE/BANDAGES/DRESSINGS) ×3 IMPLANT
GLOVE BIO SURGEON STRL SZ7.5 (GLOVE) ×3 IMPLANT
GOWN STRL REUS W/ TWL LRG LVL3 (GOWN DISPOSABLE) ×1 IMPLANT
GOWN STRL REUS W/TWL LRG LVL3 (GOWN DISPOSABLE) ×2
KIT BASIN OR (CUSTOM PROCEDURE TRAY) ×3 IMPLANT
KIT ROOM TURNOVER OR (KITS) ×3 IMPLANT
MARKER SKIN DUAL TIP RULER LAB (MISCELLANEOUS) ×3 IMPLANT
NEEDLE HYPO 25GX1X1/2 BEV (NEEDLE) ×3 IMPLANT
NS IRRIG 1000ML POUR BTL (IV SOLUTION) ×3 IMPLANT
PACK SURGICAL SETUP 50X90 (CUSTOM PROCEDURE TRAY) ×3 IMPLANT
PAD ARMBOARD 7.5X6 YLW CONV (MISCELLANEOUS) ×6 IMPLANT
PENCIL BUTTON HOLSTER BLD 10FT (ELECTRODE) IMPLANT
SWAB COLLECTION DEVICE MRSA (MISCELLANEOUS) ×3 IMPLANT
SYR BULB IRRIGATION 50ML (SYRINGE) ×3 IMPLANT
SYR CONTROL 10ML LL (SYRINGE) ×3 IMPLANT
TUBE ANAEROBIC SPECIMEN COL (MISCELLANEOUS) ×3 IMPLANT
TUBE CONNECTING 12'X1/4 (SUCTIONS) ×1
TUBE CONNECTING 12X1/4 (SUCTIONS) ×2 IMPLANT

## 2016-08-27 NOTE — Progress Notes (Signed)
  DR. Barbette MerinoJENSEN REMOVED DRAIN.  DRESSING APPLIED WITH STERILE GAUZE AND COVERED WITH HYPOFIX TAPE.  PATIENT DCD TO HOME. (ICE PACK APPLIED)

## 2016-08-27 NOTE — H&P (Signed)
HISTORY AND PHYSICAL  Nathan Mann is a 40 y.o. male patient s/p incision and drainage right mandible. Presented today for drain removal.  No diagnosis found.  Past Medical History:  Diagnosis Date  . Medical history non-contributory     No current facility-administered medications for this encounter.    Current Outpatient Prescriptions  Medication Sig Dispense Refill  . amoxicillin-clavulanate (AUGMENTIN) 875-125 MG tablet Take 1 tablet by mouth 2 (two) times daily. (Patient taking differently: Take 1 tablet by mouth 2 (two) times daily. Has 6 days left) 20 tablet 0  . chlorhexidine (PERIDEX) 0.12 % solution 15 mLs by Mouth Rinse route daily.  1  . ibuprofen (ADVIL,MOTRIN) 200 MG tablet Take 400 mg by mouth every 6 (six) hours as needed for moderate pain.    Marland Kitchen. oxyCODONE-acetaminophen (PERCOCET) 10-325 MG tablet Take 1-2 tablets by mouth every 4 (four) hours as needed for pain. 40 tablet 0  . cyclobenzaprine (FLEXERIL) 10 MG tablet Take 1 tablet (10 mg total) by mouth 3 (three) times daily as needed for muscle spasms. (Patient not taking: Reported on 08/22/2016) 15 tablet 0   No Known Allergies Active Problems:   * No active hospital problems. *  Vitals: Blood pressure 133/83, pulse 75, temperature 98.6 F (37 C), temperature source Oral, resp. rate 18, height 6\' 2"  (1.88 m), weight 91.1 kg (200 lb 13.4 oz), SpO2 100 %. Lab results:No results found for this or any previous visit (from the past 24 hour(s)). Radiology Results: No results found. General appearance: alert, cooperative and no distress Throat: pharynx clear. No drainage Neck: slight purulence on manipulation of drain. No fluctuance. Tender.  Assessment:S/p I and d right submandiular space infection. Healing well  Plan: Drain removed bedside. Dressing placed   Nathan Mann M 08/27/2016

## 2016-08-31 ENCOUNTER — Encounter (HOSPITAL_COMMUNITY): Payer: Self-pay | Admitting: Oral Surgery

## 2016-09-01 LAB — AEROBIC/ANAEROBIC CULTURE (SURGICAL/DEEP WOUND)

## 2016-09-01 LAB — AEROBIC/ANAEROBIC CULTURE W GRAM STAIN (SURGICAL/DEEP WOUND)

## 2016-12-21 ENCOUNTER — Encounter (HOSPITAL_COMMUNITY): Payer: Self-pay | Admitting: Emergency Medicine

## 2016-12-21 ENCOUNTER — Emergency Department (HOSPITAL_COMMUNITY)
Admission: EM | Admit: 2016-12-21 | Discharge: 2016-12-21 | Disposition: A | Discharge status: Home / Self Care | Payer: Medicaid Other | Attending: Dermatology | Admitting: Dermatology

## 2016-12-21 DIAGNOSIS — Z5321 Procedure and treatment not carried out due to patient leaving prior to being seen by health care provider: Secondary | ICD-10-CM | POA: Insufficient documentation

## 2016-12-21 DIAGNOSIS — R05 Cough: Secondary | ICD-10-CM | POA: Insufficient documentation

## 2016-12-21 NOTE — ED Notes (Signed)
Patient up to desk.  States he needs to take his kids home to get them in bed for school tomorrow.  Beryle Quant, RN spoke with patient, and patient decided to leave.  Will d/c.

## 2016-12-21 NOTE — ED Triage Notes (Addendum)
Pt to ED c/o URI symptoms x 2 days - cough with mucus production, sore throat, bilateral ear pain, runny nose, headache, and chest congestion. Pt states he has tried OTC meds (Delsym and Mucinex) with little relief. Pt denies fevers/chills. Pt also endorses epistaxis earlier today after blowing his nose - bled for 10 minutes, which worried him so he decided to come in. No bleeding noted at this time. Resp e/u, skin warm/dry.

## 2018-02-21 ENCOUNTER — Other Ambulatory Visit: Payer: Self-pay

## 2018-02-21 ENCOUNTER — Encounter (HOSPITAL_COMMUNITY): Payer: Self-pay | Admitting: Emergency Medicine

## 2018-02-21 ENCOUNTER — Emergency Department (HOSPITAL_COMMUNITY)
Admission: EM | Admit: 2018-02-21 | Discharge: 2018-02-21 | Disposition: A | Discharge status: Home / Self Care | Payer: Medicaid Other | Attending: Emergency Medicine | Admitting: Emergency Medicine

## 2018-02-21 DIAGNOSIS — F17210 Nicotine dependence, cigarettes, uncomplicated: Secondary | ICD-10-CM | POA: Insufficient documentation

## 2018-02-21 DIAGNOSIS — Z79899 Other long term (current) drug therapy: Secondary | ICD-10-CM | POA: Insufficient documentation

## 2018-02-21 DIAGNOSIS — H5789 Other specified disorders of eye and adnexa: Secondary | ICD-10-CM | POA: Diagnosis present

## 2018-02-21 MED ORDER — FLUORESCEIN SODIUM 1 MG OP STRP
1.0000 | ORAL_STRIP | Freq: Once | OPHTHALMIC | Status: AC
  Administered 2018-02-21: 1 via OPHTHALMIC
  Filled 2018-02-21: qty 1

## 2018-02-21 MED ORDER — TETRACAINE HCL 0.5 % OP SOLN
2.0000 [drp] | Freq: Once | OPHTHALMIC | Status: AC
  Administered 2018-02-21: 2 [drp] via OPHTHALMIC
  Filled 2018-02-21: qty 4

## 2018-02-21 NOTE — ED Notes (Signed)
Another tono pen brought to the bedside.

## 2018-02-21 NOTE — ED Notes (Signed)
Pt provided crackers and drink. Pt was thanked for his patience.

## 2018-02-21 NOTE — ED Notes (Signed)
ED Provider at bedside. 

## 2018-02-21 NOTE — Discharge Instructions (Signed)
Please go to eye doctor's office for further evaluation.

## 2018-02-21 NOTE — ED Notes (Signed)
Patient verbalized understanding of discharge papers and states he will go to the ophthalmologist as instructed. VSS. Ambulatory with steady gait.

## 2018-02-21 NOTE — ED Triage Notes (Signed)
Pt. Stated, Ive had pressure on my left side of face and my eyes are draining. Went to [facility] and gave me flonase and eye drops. This morning really bad.

## 2018-02-21 NOTE — ED Provider Notes (Signed)
[facility] EMERGENCY DEPARTMENT Provider Note   CSN: [id number] Arrival date & time: 02/21/18  4098     History   Chief Complaint Chief Complaint  Patient presents with  . Facial Pain  . Eye Drainage    HPI Nathan Mann is a 42 y.o. male.  HPI  Patient is a 42yo male with past medical history who presents to the emergency department for evaluation of eye redness, drainage and pain.  Patient reports that his symptoms began gradually 4 days ago.  States that his left eye began to get red and had clear drainage.  Reports sharp and throbbing pain in that eye which is severe and constant.  He states that he has some photophobia as well.  Denies visual disturbance.  Denies contact lens use or corrective eyeglasses.  He does not have a ophthalmologist.  He denies any recent trauma to the eye or foreign body sensation.  [facility] a told him that this was likely allergies and gave him olopatadine drops which he started taking yesterday.  He reports that his symptoms have not improved since taking this medicine.  He has tried acetaminophen for pain which helps temporarily.  He denies any contacts with similar symptoms.  Denies fevers, chills, painful EOMs, congestion, sore throat, ear pain, cough.  Past Medical History:  Diagnosis Date  . Medical history non-contributory     Patient Active Problem List   Diagnosis Date Noted  . 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 INCISE AND DRAIN ABCESS  08/22/2016    Right submandibular sublingual space abscess.  . INCISION AND DRAINAGE ABSCESS Right 08/22/2016   Procedure: INCISION AND DRAINAGE RIGHT MANDIBLE ABSCESS;  Surgeon: Ocie Doyne, DDS;  Location: [facility] OR;  Service: Oral Surgery;  Laterality: Right;  . INCISION AND DRAINAGE ABSCESS Right 08/27/2016   Procedure: REMOVE DRAIN RIGHT MANDIBLE;  Surgeon: Ocie Doyne, DDS;   Location: [facility] OR;  Service: Oral Surgery;  Laterality: Right;  . WISDOM TOOTH EXTRACTION  08/18/2016        Home Medications    Prior to Admission medications   Medication Sig Start Date End Date Taking? Authorizing Provider  amoxicillin-clavulanate (AUGMENTIN) 875-125 MG tablet Take 1 tablet by mouth 2 (two) times daily. Patient taking differently: Take 1 tablet by mouth 2 (two) times daily. Has 6 days left 08/23/16   Ocie Doyne, DDS  chlorhexidine (PERIDEX) 0.12 % solution 15 mLs by Mouth Rinse route daily. 08/22/16   [provider]  cyclobenzaprine (FLEXERIL) 10 MG tablet Take 1 tablet (10 mg total) by mouth 3 (three) times daily as needed for muscle spasms. Patient not taking: Reported on 08/22/2016 08/03/13   Trixie Dredge, PA-C  ibuprofen (ADVIL,MOTRIN) 200 MG tablet Take 400 mg by mouth every 6 (six) hours as needed for moderate pain.    [provider]  oxyCODONE-acetaminophen (PERCOCET) 10-325 MG tablet Take 1-2 tablets by mouth every 4 (four) hours as needed for pain. 08/23/16   Ocie Doyne, DDS    Family History No family history on file.  Social History Social History   Tobacco Use  . Smoking status: Former Smoker    Types: Cigarettes    Last attempt to quit: 08/18/2016    Years since quitting: 1.5  . Smokeless tobacco: Never Used  Substance Use Topics  . Alcohol use: No  . Drug use: No  Allergies   Patient has no known allergies.   Review of Systems Review of Systems  Constitutional: Negative for chills and fever.  HENT: Negative for congestion, ear pain, facial swelling, rhinorrhea, sinus pressure and sore throat.   Eyes: Positive for photophobia, pain, discharge and redness. Negative for itching and visual disturbance.  Respiratory: Negative for cough.      Physical Exam Updated Vital Signs BP (!) 137/92 (BP Location: Right Arm)   Pulse 79   Temp 98.3 F (36.8 C) (Oral)   Resp 17   Ht 6\' 2"  (1.88 m)   Wt 97.5 kg (215 lb)    SpO2 100%   BMI 27.60 kg/m   Physical Exam  Constitutional: He appears well-developed and well-nourished. No distress.  Sitting at bedside in no apparent distress, nontoxic-appearing.  HENT:  Head: Normocephalic and atraumatic.  Eyes: Right eye exhibits no discharge. Left eye exhibits no discharge.  Appearance. No eyelid swelling. Left eye with scleral erythema. No discharge. PERRL intact. Consensual photophobia present. EOMI without nystagmus.  Corneal Abrasion Exam VCO. Risks, benefits and alternatives explained. 2 drops of tetracaine (PONTOCAINE) 0.5 % ophthalmic solution were applied to the left eye. Fluorescein 1 MG ophthalmic strip applied the the surface of the left eye Woods lamp used to screen for abrasion. No corneal abrasion. No corneal ulcer. No hyphema. No dendritic lesion. Negative Seidel sign. No foreign bodies noted. No visible hyphema.  Patient tolerated the procedure well TONOPEN: 18LE  Pulmonary/Chest: Effort normal. No respiratory distress.  Neurological: He is alert. Coordination normal.  Skin: He is not diaphoretic.  Psychiatric: He has a normal mood and affect. His behavior is normal.  Nursing note and vitals reviewed.    ED Treatments / Results  Labs (all labs ordered are listed, but only abnormal results are displayed) Labs Reviewed - No data to display  EKG None  Radiology No results found.  Procedures Procedures (including critical care time)  Medications Ordered in ED Medications  fluorescein ophthalmic strip 1 strip (1 strip Left Eye Given 02/21/18 0950)  tetracaine (PONTOCAINE) 0.5 % ophthalmic solution 2 drop (2 drops Left Eye Given 02/21/18 0950)     Initial Impression / Assessment and Plan / ED Course  I have reviewed the triage vital signs and the nursing notes.  Pertinent labs & imaging results that were available during my care of the patient were reviewed by me and considered in my medical decision making (see chart for  details).     Concern for anterior uveitis given scleral erythema, pain and photophobia.  No purulent discharge, corneal abrasions, entrapment, or dendritic staining with fluorescein study.  Presentation non-concerning for bacterial conjunctivitis, corneal abrasions, or HSV. Tonopen 18LE, no concern for acute closed angle closure glaucoma. Visual acuity 20/30LE and 20/30RE. Discussed this patient with Ophthalmologist Dr. Charlotte SanesMcCuen who will see patient in the office today for further evaluation. Patient agrees with this plan. Discussed this patient with Dr. Silverio LayYao who agrees with above plan.   Final Clinical Impressions(s) / ED Diagnoses   Final diagnoses:  Red eye    ED Discharge Orders    None       Lawrence MarseillesShrosbree, Emily J, PA-C 02/21/18 1801    Charlynne PanderYao, David Hsienta, MD 02/26/18 2000

## 2020-09-23 ENCOUNTER — Other Ambulatory Visit: Payer: Self-pay | Admitting: Family Medicine

## 2020-09-23 ENCOUNTER — Ambulatory Visit
Admission: RE | Admit: 2020-09-23 | Discharge: 2020-09-23 | Disposition: A | Discharge status: Home / Self Care | Payer: Medicaid Other | Source: Ambulatory Visit | Attending: Family Medicine | Admitting: Family Medicine

## 2020-09-23 DIAGNOSIS — R52 Pain, unspecified: Secondary | ICD-10-CM

## 2020-09-23 DIAGNOSIS — R609 Edema, unspecified: Secondary | ICD-10-CM

## 2022-06-25 IMAGING — DX DG KNEE COMPLETE 4+V*L*
4 series · 4 of 4 positions shown · non-contrast
Comparison: None.

CLINICAL DATA: Left knee pain and swelling

EXAM:
LEFT KNEE - COMPLETE 4+ VIEW

[dg knee complete 4 views left (1 of 4)]
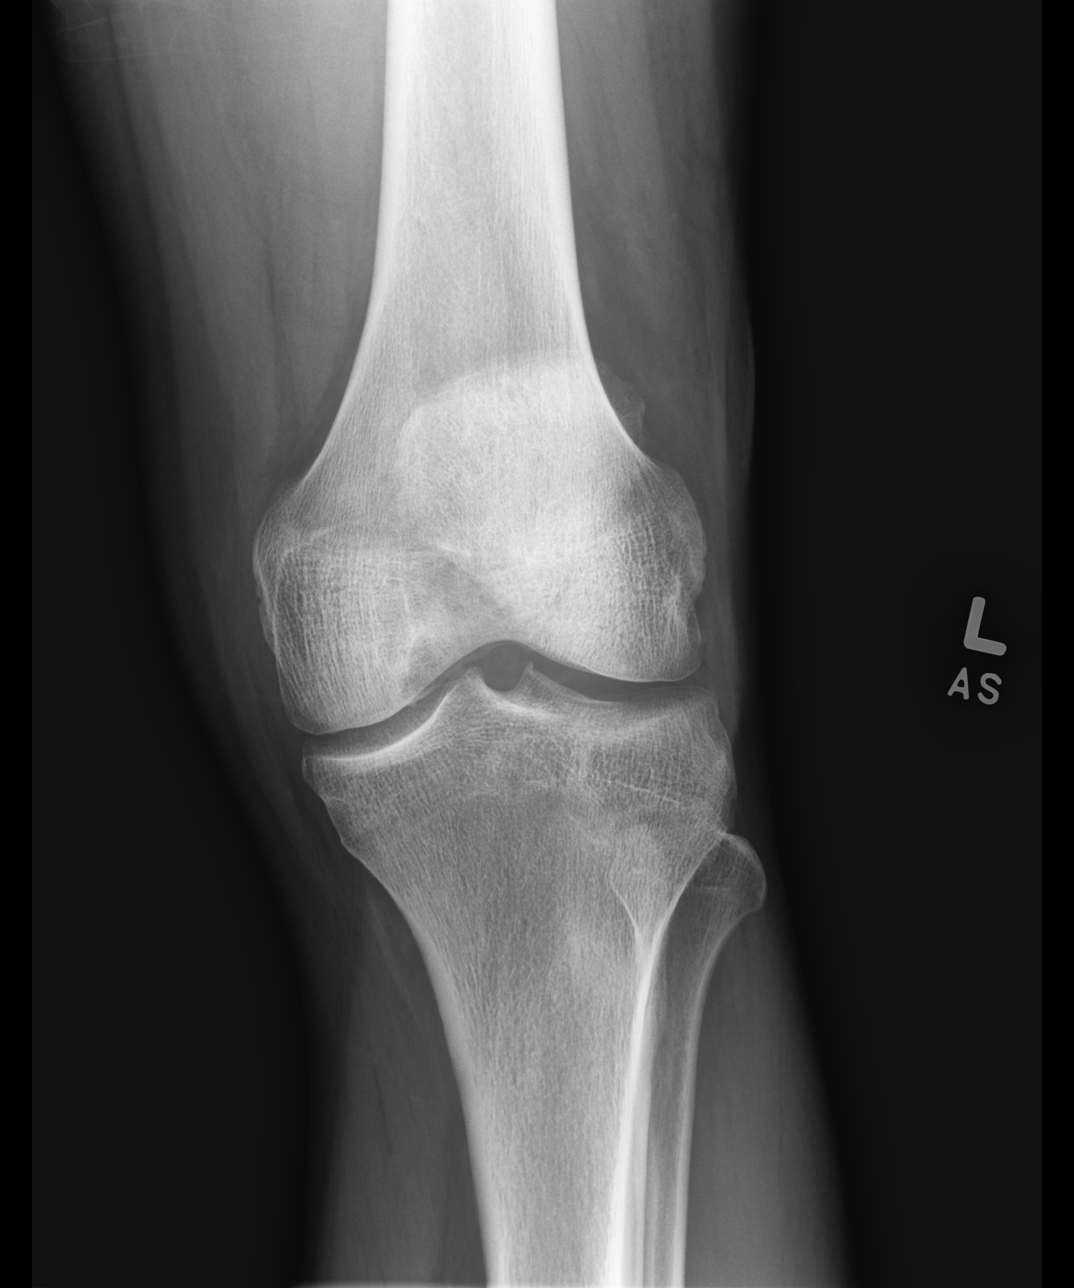

[dg knee complete 4 views left (2 of 4)]
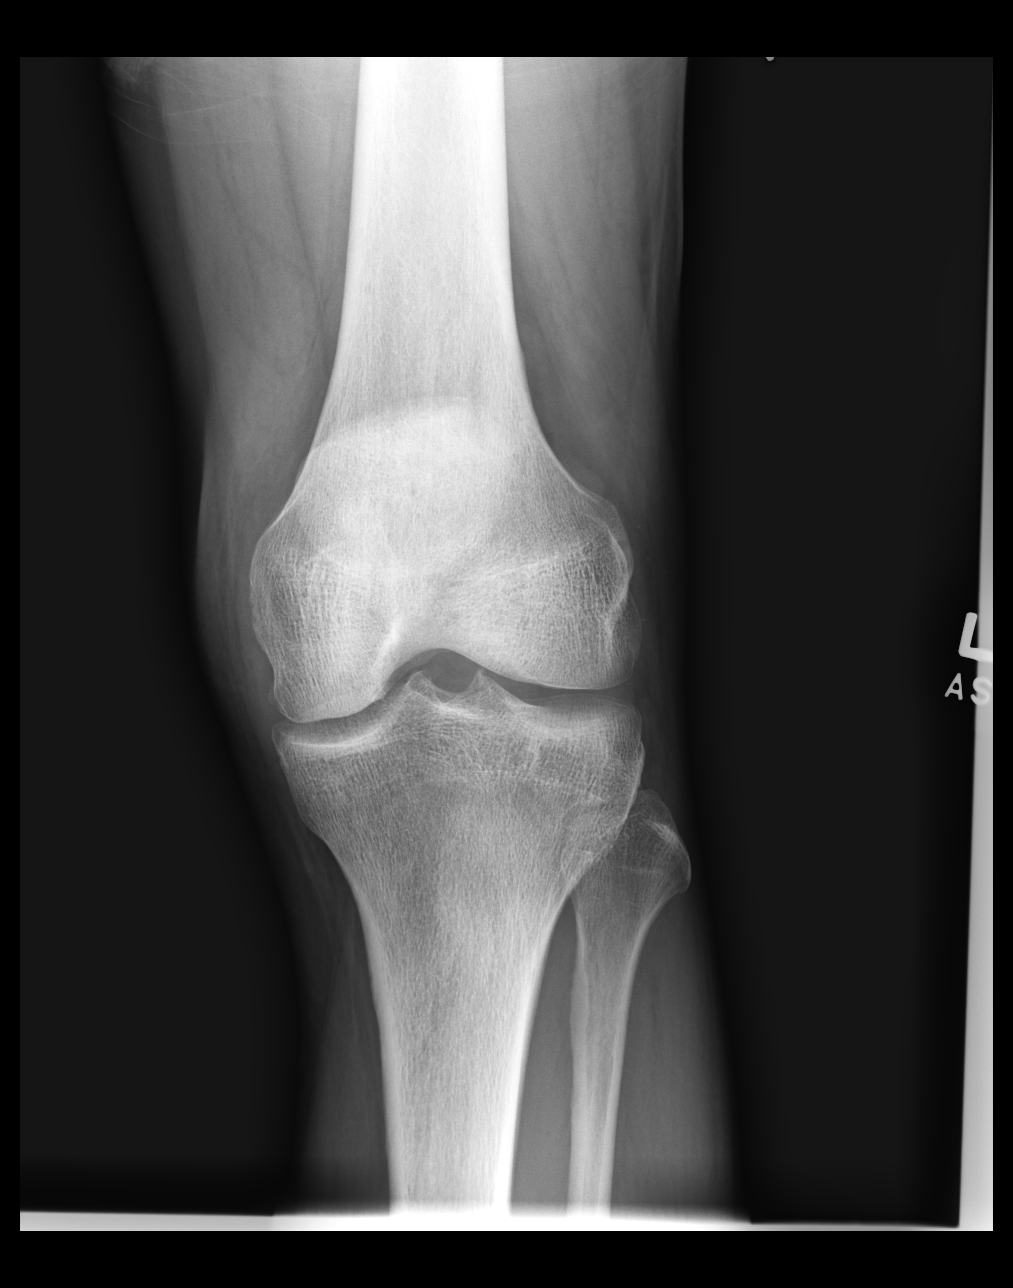

[dg knee complete 4 views left (3 of 4)]
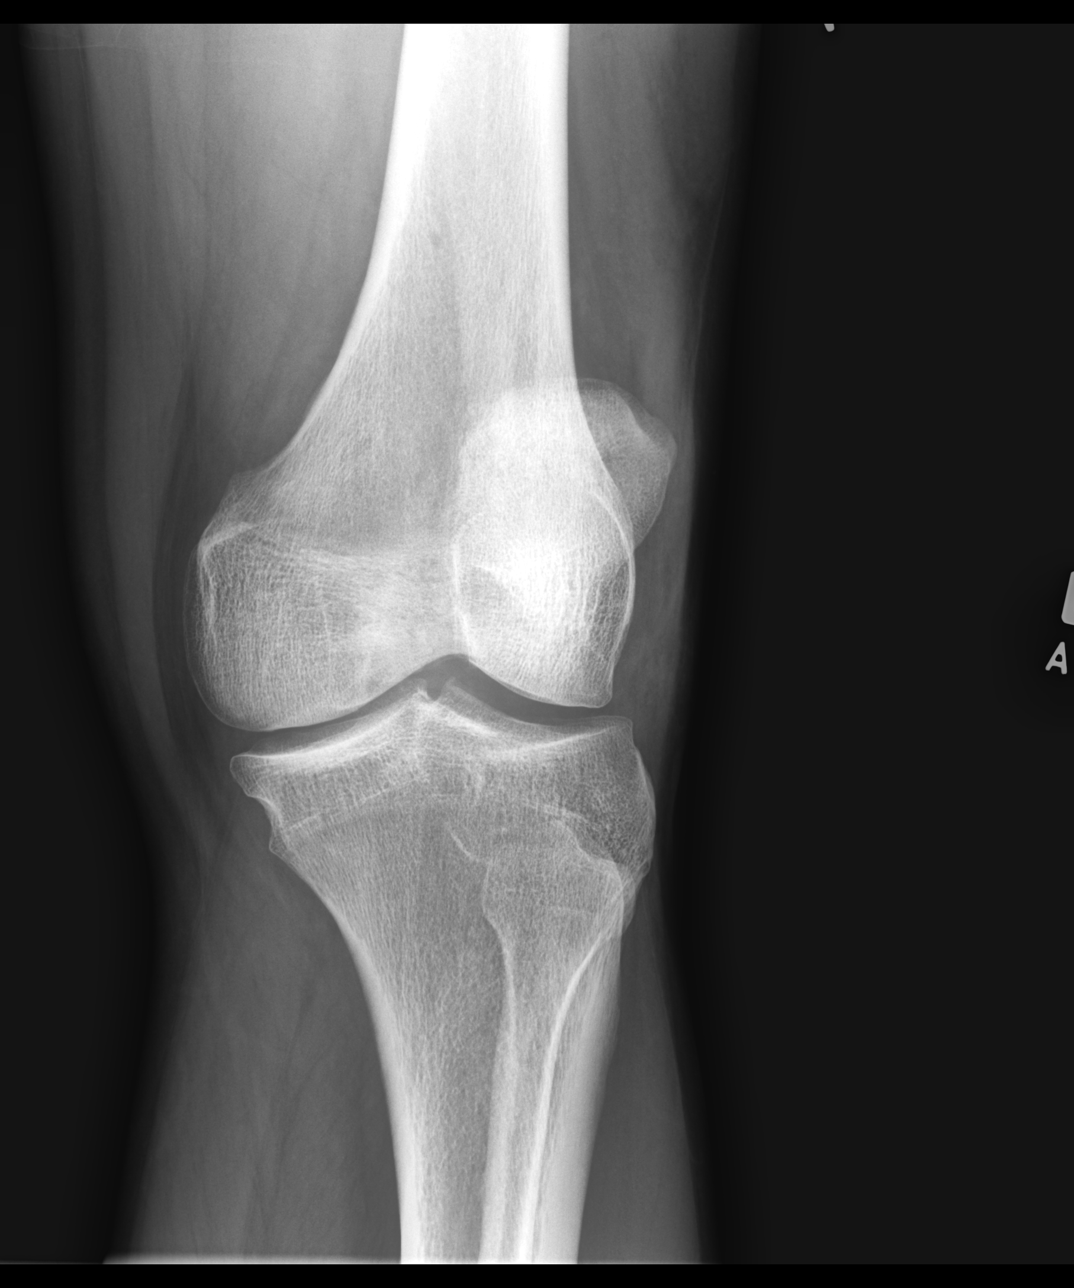

[dg knee complete 4 views left (4 of 4)]
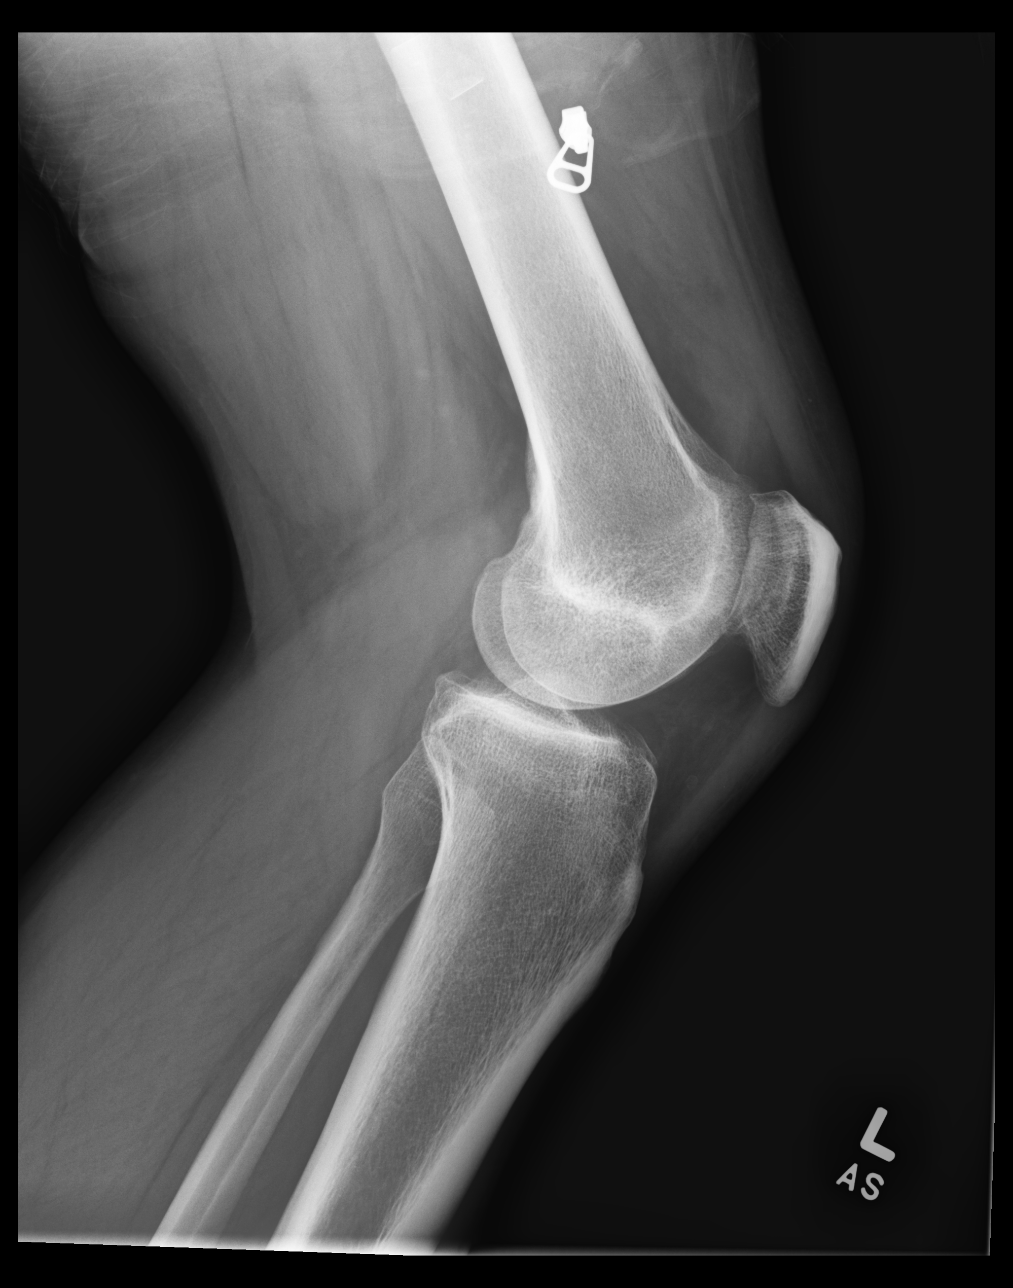

[4 of 4 positions shown; findings below may reference images not displayed]

FINDINGS: No evidence of fracture, dislocation, or joint effusion. No minimal
spurring of the patellofemoral compartment. Soft tissues are
unremarkable.
IMPRESSION: No acute abnormality of the left knee.

## 2023-05-26 ENCOUNTER — Telehealth: Payer: Medicaid Other | Admitting: Physician Assistant

## 2023-05-26 DIAGNOSIS — J069 Acute upper respiratory infection, unspecified: Secondary | ICD-10-CM | POA: Diagnosis not present

## 2023-05-26 MED ORDER — PSEUDOEPH-BROMPHEN-DM 30-2-10 MG/5ML PO SYRP: 5.0000 mL | ORAL_SOLUTION | Freq: Four times a day (QID) | ORAL | 0 refills | Status: AC | PRN

## 2023-05-26 MED ORDER — ONDANSETRON 4 MG PO TBDP: 4.0000 mg | ORAL_TABLET | Freq: Three times a day (TID) | ORAL | 0 refills | Status: AC | PRN

## 2023-05-26 MED ORDER — FLUTICASONE PROPIONATE 50 MCG/ACT NA SUSP: 2.0000 | Freq: Every day | NASAL | 0 refills | Status: AC

## 2023-05-26 NOTE — Progress Notes (Signed)
Virtual Visit Consent   Nathan Mann, you are scheduled for a virtual visit with a [facility] provider today. Just as with appointments in the office, your consent must be obtained to participate. Your consent will be active for this visit and any virtual visit you may have with one of our providers in the next 365 days. If you have a MyChart account, a copy of this consent can be sent to you electronically.  As this is a virtual visit, video technology does not allow for your provider to perform a traditional examination. This may limit your provider's ability to fully assess your condition. If your provider identifies any concerns that need to be evaluated in person or the need to arrange testing (such as labs, EKG, etc.), we will make arrangements to do so. Although advances in technology are sophisticated, we cannot ensure that it will always work on either your end or our end. If the connection with a video visit is poor, the visit may have to be switched to a telephone visit. With either a video or telephone visit, we are not always able to ensure that we have a secure connection.  By engaging in this virtual visit, you consent to the provision of healthcare and authorize for your insurance to be billed (if applicable) for the services provided during this visit. Depending on your insurance coverage, you may receive a charge related to this service.  I need to obtain your verbal consent now. Are you willing to proceed with your visit today? Nathan Mann has provided verbal consent on 05/26/2023 for a virtual visit (video or telephone). Margaretann Loveless, PA-C  Date: 05/26/2023 2:32 PM  Virtual Visit via Video Note   IMargaretann Loveless, connected with  Nathan Mann  ([id number], 1976/01/24) on 05/26/23 at  2:30 PM EDT by a video-enabled telemedicine application and verified that I am speaking with the correct person using two identifiers.  Location: Patient: Virtual Visit  Location Patient: Home Provider: Virtual Visit Location Provider: Home Office   I discussed the limitations of evaluation and management by telemedicine and the availability of in person appointments. The patient expressed understanding and agreed to proceed.    History of Present Illness: Nathan Mann is a 47 y.o. who identifies as a male who was assigned male at birth, and is being seen today for URI symptoms.  HPI: URI  This is a new problem. The current episode started yesterday. The problem has been gradually worsening. There has been no fever. Associated symptoms include congestion, coughing, headaches, nausea, sinus pain and a sore throat. Pertinent negatives include no diarrhea, ear pain, plugged ear sensation, rhinorrhea or vomiting. Associated symptoms comments: Chills, post nasal drainage, eyes have pressure behind them. Treatments tried: dayquil. The treatment provided no relief.     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 (Patient taking differently: Take 1 tablet by mouth 2 (two) times daily. Has 6 days left), Disp: 20 tablet, Rfl: 0   brompheniramine-pseudoephedrine-DM 30-2-10 MG/5ML syrup, Take 5 mLs by mouth 4 (four) times daily as needed., Disp: 120 mL, Rfl: 0   chlorhexidine (PERIDEX) 0.12 % solution, 15 mLs by Mouth Rinse route daily., Disp: , Rfl: 1   cyclobenzaprine (FLEXERIL) 10 MG tablet, Take  1 tablet (10 mg total) by mouth 3 (three) times daily as needed for muscle spasms. (Patient not taking: Reported on 08/22/2016), Disp: 15 tablet, Rfl: 0   fluticasone (FLONASE) 50 MCG/ACT nasal spray, Place 2 sprays into both nostrils daily., Disp: 16 g, Rfl: 0   ibuprofen (ADVIL,MOTRIN) 200 MG tablet, Take 400 mg by mouth every 6  (six) hours as needed for moderate pain., Disp: , Rfl:    ondansetron (ZOFRAN-ODT) 4 MG disintegrating tablet, Take 1 tablet (4 mg total) by mouth every 8 (eight) hours as needed., Disp: 20 tablet, Rfl: 0   oxyCODONE-acetaminophen (PERCOCET) 10-325 MG tablet, Take 1-2 tablets by mouth every 4 (four) hours as needed for pain., Disp: 40 tablet, Rfl: 0  Observations/Objective: Patient is well-developed, well-nourished in no acute distress.  Resting comfortably at home.  Head is normocephalic, atraumatic.  No labored breathing. Speech is clear and coherent with logical content.  Patient is alert and oriented at baseline.    Assessment and Plan: 1. Viral URI with cough - brompheniramine-pseudoephedrine-DM 30-2-10 MG/5ML syrup; Take 5 mLs by mouth 4 (four) times daily as needed.  Dispense: 120 mL; Refill: 0 - fluticasone (FLONASE) 50 MCG/ACT nasal spray; Place 2 sprays into both nostrils daily.  Dispense: 16 g; Refill: 0 - ondansetron (ZOFRAN-ODT) 4 MG disintegrating tablet; Take 1 tablet (4 mg total) by mouth every 8 (eight) hours as needed.  Dispense: 20 tablet; Refill: 0  - Suspect viral URI - Symptomatic medications of choice over the counter as needed - Bromfed DM for cough prescribed - Flonase for nasal congestion - Zofran for nausea - Push fluids - Rest - Seek further evaluation if symptoms change or worsen   Follow Up Instructions: I discussed the assessment and treatment plan with the patient. The patient was provided an opportunity to ask questions and all were answered. The patient agreed with the plan and demonstrated an understanding of the instructions.  A copy of instructions were sent to the patient via MyChart unless otherwise noted below.    The patient was advised to call back or seek an in-person evaluation if the symptoms worsen or if the condition fails to improve as anticipated.  Time:  I spent 10 minutes with the patient via telehealth technology discussing the  above problems/concerns.    Margaretann Loveless, PA-C

## 2023-05-26 NOTE — Patient Instructions (Signed)
Nathan Mann, thank you for joining Margaretann Loveless, PA-C for today's virtual visit.  While this provider is not your primary care provider (PCP), if your PCP is located in our provider database this encounter information will be shared with them immediately following your visit.   A [facility] MyChart account gives you access to today's visit and all your visits, tests, and labs performed at [facility] " click here if you don't have a [facility] MyChart account or go to [email or web address]  Consent: (Patient) Nathan Mann provided verbal consent for this virtual visit at the beginning of the encounter.  Current Medications:  Current Outpatient Medications:    amoxicillin-clavulanate (AUGMENTIN) 875-125 MG tablet, Take 1 tablet by mouth 2 (two) times daily. (Patient taking differently: Take 1 tablet by mouth 2 (two) times daily. Has 6 days left), Disp: 20 tablet, Rfl: 0   brompheniramine-pseudoephedrine-DM 30-2-10 MG/5ML syrup, Take 5 mLs by mouth 4 (four) times daily as needed., Disp: 120 mL, Rfl: 0   chlorhexidine (PERIDEX) 0.12 % solution, 15 mLs by Mouth Rinse route daily., Disp: , Rfl: 1   cyclobenzaprine (FLEXERIL) 10 MG tablet, Take 1 tablet (10 mg total) by mouth 3 (three) times daily as needed for muscle spasms. (Patient not taking: Reported on 08/22/2016), Disp: 15 tablet, Rfl: 0   fluticasone (FLONASE) 50 MCG/ACT nasal spray, Place 2 sprays into both nostrils daily., Disp: 16 g, Rfl: 0   ibuprofen (ADVIL,MOTRIN) 200 MG tablet, Take 400 mg by mouth every 6 (six) hours as needed for moderate pain., Disp: , Rfl:    ondansetron (ZOFRAN-ODT) 4 MG disintegrating tablet, Take 1 tablet (4 mg total) by mouth every 8 (eight) hours as needed., Disp: 20 tablet, Rfl: 0   oxyCODONE-acetaminophen (PERCOCET) 10-325 MG tablet, Take 1-2 tablets by mouth every 4 (four) hours as needed for pain., Disp: 40 tablet, Rfl: 0   Medications ordered in this encounter:  Meds  ordered this encounter  Medications   brompheniramine-pseudoephedrine-DM 30-2-10 MG/5ML syrup    Sig: Take 5 mLs by mouth 4 (four) times daily as needed.    Dispense:  120 mL    Refill:  0    Order Specific Question:   Supervising Provider    Answer:   Merrilee Jansky [[id number]]   fluticasone (FLONASE) 50 MCG/ACT nasal spray    Sig: Place 2 sprays into both nostrils daily.    Dispense:  16 g    Refill:  0    Order Specific Question:   Supervising Provider    Answer:   LAMPTEY, PHILIP O [[id number]]   ondansetron (ZOFRAN-ODT) 4 MG disintegrating tablet    Sig: Take 1 tablet (4 mg total) by mouth every 8 (eight) hours as needed.    Dispense:  20 tablet    Refill:  0    Order Specific Question:   Supervising Provider    Answer:   Merrilee Jansky [id number]     *If you need refills on other medications prior to your next appointment, please contact your pharmacy*  Follow-Up: Call back or seek an in-person evaluation if the symptoms worsen or if the condition fails to improve as anticipated.  [facility] [phone]  Other Instructions Viral Respiratory Infection A respiratory infection is an illness that affects part of the respiratory system, such as the lungs, nose, or throat. A respiratory infection that is caused by a virus is called a viral respiratory infection. Common types of viral respiratory infections include:  A cold. The flu (influenza). A respiratory syncytial virus (RSV) infection. What are the causes? This condition is caused by a virus. The virus may spread through contact with droplets or direct contact with infected people or their mucus or secretions. The virus may spread from person to person (is contagious). What are the signs or symptoms? Symptoms of this condition include: A stuffy or runny nose. A sore throat or cough. Shortness of breath or difficulty breathing. Yellow or green mucus (sputum). Other symptoms may include: A  fever. Sweating or chills. Fatigue. Achy muscles. A headache. How is this diagnosed? This condition may be diagnosed based on: Your symptoms. A physical exam. Testing of secretions from the nose or throat. Chest X-ray. How is this treated? This condition may be treated with medicines, such as: Antiviral medicine. This may shorten the length of time a person has symptoms. Expectorants. These make it easier to cough up mucus. Decongestant nasal sprays. Acetaminophen or NSAIDs, such as ibuprofen, to relieve fever and pain. Antibiotic medicines are not prescribed for viral infections.This is because antibiotics are designed to kill bacteria. They do not kill viruses. Follow these instructions at home: Managing pain and congestion Take over-the-counter and prescription medicines only as told by your health care provider. If you have a sore throat, gargle with a mixture of salt and water 3-4 times a day or as needed. To make salt water, completely dissolve -1 tsp (3-6 g) of salt in 1 cup (237 mL) of warm water. Use nose drops made from salt water to ease congestion and soften raw skin around your nose. Take 2 tsp (10 mL) of honey at bedtime to lessen coughing at night. Do not give honey to children who are younger than 1 year. Drink enough fluid to keep your urine pale yellow. This helps prevent dehydration and helps loosen up mucus. General instructions  Rest as much as possible. Do not drink alcohol. Do not use any products that contain nicotine or tobacco. These products include cigarettes, chewing tobacco, and vaping devices, such as e-cigarettes. If you need help quitting, ask your health care provider. Keep all follow-up visits. This is important. How is this prevented?     Get an annual flu shot. You may get the flu shot in late summer, fall, or winter. Ask your health care provider when you should get your flu shot. Avoid spreading your infection to other people. If you are  sick: Wash your hands with soap and water often, especially after you cough or sneeze. Wash for at least 20 seconds. If soap and water are not available, use alcohol-based hand sanitizer. Cover your mouth when you cough. Cover your nose and mouth when you sneeze. Do not share cups or eating utensils. Clean commonly used objects often. Clean commonly touched surfaces. Stay home from work or school as told by your health care provider. Avoid contact with people who are sick during cold and flu season. This is generally fall and winter. Contact a health care provider if: Your symptoms last for 10 days or longer. Your symptoms get worse over time. You have severe sinus pain in your face or forehead. The glands in your jaw or neck become very swollen. You have shortness of breath. Get help right away if you: Feel pain or pressure in your chest. Have trouble breathing. Faint or feel like you will faint. Have severe and persistent vomiting. Feel confused or disoriented. These symptoms may represent a serious problem that is an emergency. Do not  wait to see if the symptoms will go away. Get medical help right away. Call your local emergency services (911 in the [place] Do not drive yourself to the hospital. Summary A respiratory infection is an illness that affects part of the respiratory system, such as the lungs, nose, or throat. A respiratory infection that is caused by a virus is called a viral respiratory infection. Common types of viral respiratory infections include a cold, influenza, and respiratory syncytial virus (RSV) infection. Symptoms of this condition include a stuffy or runny nose, cough, fatigue, achy muscles, sore throat, and fevers or chills. Antibiotic medicines are not prescribed for viral infections. This is because antibiotics are designed to kill bacteria. They are not effective against viruses. This information is not intended to replace advice given to you by your health  care provider. Make sure you discuss any questions you have with your health care provider. Document Revised: 12/04/2020 Document Reviewed: 12/04/2020 Elsevier Patient Education  2024 [employer]    If you have been instructed to have an in-person evaluation today at a local Urgent Care facility, please use the link below. It will take you to a list of all of our available [facility], including address, phone number and hours of operation. Please do not delay care.  [facility]  If you or a family member do not have a primary care provider, use the link below to schedule a visit and establish care. When you choose a [facility] primary care physician or advanced practice provider, you gain a long-term partner in health. Find a Primary Care Provider  Learn more about [facility]'s in-office and virtual care options: [facility] - Get Care Now

## 2024-01-05 ENCOUNTER — Telehealth: Admitting: Physician Assistant

## 2024-01-05 DIAGNOSIS — M549 Dorsalgia, unspecified: Secondary | ICD-10-CM | POA: Diagnosis not present

## 2024-01-05 MED ORDER — CYCLOBENZAPRINE HCL 10 MG PO TABS: 5.0000 mg | ORAL_TABLET | Freq: Three times a day (TID) | ORAL | 0 refills | Status: AC | PRN

## 2024-01-05 MED ORDER — METHYLPREDNISOLONE 4 MG PO TBPK: ORAL_TABLET | ORAL | 0 refills | Status: AC

## 2024-01-05 NOTE — Patient Instructions (Signed)
 Nathan Mann, thank you for joining Angelia Kelp, PA-C for today's virtual visit.  While this provider is not your primary care provider (PCP), if your PCP is located in our provider database this encounter information will be shared with them immediately following your visit.   A [facility] MyChart account gives you access to today's visit and all your visits, tests, and labs performed at [facility] " click here if you don't have a [facility] MyChart account or go to [email or web address]  Consent: (Patient) Nathan Mann provided verbal consent for this virtual visit at the beginning of the encounter.  Current Medications:  Current Outpatient Medications:    cyclobenzaprine  (FLEXERIL ) 10 MG tablet, Take 0.5-1 tablets (5-10 mg total) by mouth 3 (three) times daily as needed., Disp: 30 tablet, Rfl: 0   methylPREDNISolone  (MEDROL  DOSEPAK) 4 MG TBPK tablet, 6 day taper; take as directed on package instructions, Disp: 21 tablet, Rfl: 0   amoxicillin -clavulanate (AUGMENTIN ) 875-125 MG tablet, Take 1 tablet by mouth 2 (two) times daily. (Patient taking differently: Take 1 tablet by mouth 2 (two) times daily. Has 6 days left), Disp: 20 tablet, Rfl: 0   brompheniramine-pseudoephedrine -DM 30-2-10 MG/5ML syrup, Take 5 mLs by mouth 4 (four) times daily as needed., Disp: 120 mL, Rfl: 0   chlorhexidine (PERIDEX) 0.12 % solution, 15 mLs by Mouth Rinse route daily., Disp: , Rfl: 1   fluticasone  (FLONASE ) 50 MCG/ACT nasal spray, Place 2 sprays into both nostrils daily., Disp: 16 g, Rfl: 0   ibuprofen  (ADVIL ,MOTRIN ) 200 MG tablet, Take 400 mg by mouth every 6 (six) hours as needed for moderate pain., Disp: , Rfl:    ondansetron  (ZOFRAN -ODT) 4 MG disintegrating tablet, Take 1 tablet (4 mg total) by mouth every 8 (eight) hours as needed., Disp: 20 tablet, Rfl: 0   oxyCODONE -acetaminophen  (PERCOCET) 10-325 MG tablet, Take 1-2 tablets by mouth every 4 (four) hours as needed for  pain., Disp: 40 tablet, Rfl: 0   Medications ordered in this encounter:  Meds ordered this encounter  Medications   methylPREDNISolone  (MEDROL  DOSEPAK) 4 MG TBPK tablet    Sig: 6 day taper; take as directed on package instructions    Dispense:  21 tablet    Refill:  0    Supervising Provider:   LAMPTEY, PHILIP O [[id number]]   cyclobenzaprine  (FLEXERIL ) 10 MG tablet    Sig: Take 0.5-1 tablets (5-10 mg total) by mouth 3 (three) times daily as needed.    Dispense:  30 tablet    Refill:  0    Supervising Provider:   LAMPTEY, PHILIP O [[id number]]     *If you need refills on other medications prior to your next appointment, please contact your pharmacy*  Follow-Up: Call back or seek an in-person evaluation if the symptoms worsen or if the condition fails to improve as anticipated.  [facility] [phone]  Other Instructions Neck Exercises Ask your health care provider which exercises are safe for you. Do exercises exactly as told by your health care provider and adjust them as directed. It is normal to feel mild stretching, pulling, tightness, or discomfort as you do these exercises. Stop right away if you feel sudden pain or your pain gets worse. Do not begin these exercises until told by your health care provider. Neck exercises can be important for many reasons. They can improve strength and maintain flexibility in your neck, which will help your upper back and prevent neck pain. Stretching exercises Rotation neck  stretching  Sit in a chair or stand up. Place your feet flat on the floor, shoulder-width apart. Slowly turn your head (rotate) to the right until a slight stretch is felt. Turn it all the way to the right so you can look over your right shoulder. Do not tilt or tip your head. Hold this position for 10-30 seconds. Slowly turn your head (rotate) to the left until a slight stretch is felt. Turn it all the way to the left so you can look over your left shoulder. Do  not tilt or tip your head. Hold this position for 10-30 seconds. Repeat __________ times. Complete this exercise __________ times a day. Neck retraction  Sit in a sturdy chair or stand up. Look straight ahead. Do not bend your neck. Use your fingers to push your chin backward (retraction). Do not bend your neck for this movement. Continue to face straight ahead. If you are doing the exercise properly, you will feel a slight sensation in your throat and a stretch at the back of your neck. Hold the stretch for 1-2 seconds. Repeat __________ times. Complete this exercise __________ times a day. Strengthening exercises Neck press  Lie on your back on a firm bed or on the floor with a pillow under your head. Use your neck muscles to push your head down on the pillow and straighten your spine. Hold the position as well as you can. Keep your head facing up (in a neutral position) and your chin tucked. Slowly count to 5 while holding this position. Repeat __________ times. Complete this exercise __________ times a day. Isometrics These are exercises in which you strengthen the muscles in your neck while keeping your neck still (isometrics). Sit in a supportive chair and place your hand on your forehead. Keep your head and face facing straight ahead. Do not flex or extend your neck while doing isometrics. Push forward with your head and neck while pushing back with your hand. Hold for 10 seconds. Do the sequence again, this time putting your hand against the back of your head. Use your head and neck to push backward against the hand pressure. Finally, do the same exercise on either side of your head, pushing sideways against the pressure of your hand. Repeat __________ times. Complete this exercise __________ times a day. Prone head lifts  Lie face-down (prone position), resting on your elbows so that your chest and upper back are raised. Start with your head facing downward, near your chest.  Position your chin either on or near your chest. Slowly lift your head upward. Lift until you are looking straight ahead. Then continue lifting your head as far back as you can comfortably stretch. Hold your head up for 5 seconds. Then slowly lower it to your starting position. Repeat __________ times. Complete this exercise __________ times a day. Supine head lifts  Lie on your back (supine position), bending your knees to point to the ceiling and keeping your feet flat on the floor. Lift your head slowly off the floor, raising your chin toward your chest. Hold for 5 seconds. Repeat __________ times. Complete this exercise __________ times a day. Scapular retraction  Stand with your arms at your sides. Look straight ahead. Slowly pull both shoulders (scapulae) backward and downward (retraction) until you feel a stretch between your shoulder blades in your upper back. Hold for 10-30 seconds. Relax and repeat. Repeat __________ times. Complete this exercise __________ times a day. Contact a health care provider if: Your neck  pain or discomfort gets worse when you do an exercise. Your neck pain or discomfort does not improve within 2 hours after you exercise. If you have any of these problems, stop exercising right away. Do not do the exercises again unless your health care provider says that you can. Get help right away if: You develop sudden, severe neck pain. If this happens, stop exercising right away. Do not do the exercises again unless your health care provider says that you can. This information is not intended to replace advice given to you by your health care provider. Make sure you discuss any questions you have with your health care provider. Document Revised: 02/24/2021 Document Reviewed: 02/24/2021 Elsevier Patient Education  2024 [employer]   If you have been instructed to have an in-person evaluation today at a local Urgent Care facility, please use the link below. It  will take you to a list of all of our available [facility], including address, phone number and hours of operation. Please do not delay care.  [facility]  If you or a family member do not have a primary care provider, use the link below to schedule a visit and establish care. When you choose a [facility] primary care physician or advanced practice provider, you gain a long-term partner in health. Find a Primary Care Provider  Learn more about [facility]'s in-office and virtual care options: [facility] - Get Care Now

## 2024-01-05 NOTE — Progress Notes (Signed)
 Virtual Visit Consent   Nathan Mann, you are scheduled for a virtual visit with a [facility] provider today. Just as with appointments in the office, your consent must be obtained to participate. Your consent will be active for this visit and any virtual visit you may have with one of our providers in the next 365 days. If you have a MyChart account, a copy of this consent can be sent to you electronically.  As this is a virtual visit, video technology does not allow for your provider to perform a traditional examination. This may limit your provider's ability to fully assess your condition. If your provider identifies any concerns that need to be evaluated in person or the need to arrange testing (such as labs, EKG, etc.), we will make arrangements to do so. Although advances in technology are sophisticated, we cannot ensure that it will always work on either your end or our end. If the connection with a video visit is poor, the visit may have to be switched to a telephone visit. With either a video or telephone visit, we are not always able to ensure that we have a secure connection.  By engaging in this virtual visit, you consent to the provision of healthcare and authorize for your insurance to be billed (if applicable) for the services provided during this visit. Depending on your insurance coverage, you may receive a charge related to this service.  I need to obtain your verbal consent now. Are you willing to proceed with your visit today? Nathan Mann has provided verbal consent on 01/05/2024 for a virtual visit (video or telephone). Nathan Kelp, PA-C  Date: 01/05/2024 1:59 PM   Virtual Visit via Video Note   IAngelia Mann, connected with  Nathan Mann  ([id number], 07-02-1976) on 01/05/24 at  1:45 PM EDT by a video-enabled telemedicine application and verified that I am speaking with the correct person using two identifiers.  Location: Patient: Virtual Visit  Location Patient: Home Provider: Virtual Visit Location Provider: Home Office   I discussed the limitations of evaluation and management by telemedicine and the availability of in person appointments. The patient expressed understanding and agreed to proceed.    History of Present Illness: Nathan Mann is a 48 y.o. who identifies as a male who was assigned male at birth, and is being seen today for left upper back pain and left sided chest pain.  HPI: Back Pain This is a new problem. 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 The problem has been rapidly improving since onset. The pain is present in the thoracic spine (left upper trapezius area and between shoulder blade on left). The quality of the pain is described as stabbing and cramping. Radiates to: radiated into the left arm and left anterior chest. The pain is moderate. The pain is Worse during the night. The symptoms are aggravated by position (certain movements of the neck pull on the muscle). Stiffness is present In the morning. Associated symptoms include chest pain (radiating into chest when was at worst, but improved now with tylenol ) and numbness (positional while laying down in left arm, now resolved). Pertinent negatives include no bladder incontinence, bowel incontinence, fever, headaches, paresis, paresthesias, tingling or weakness. Treatments tried: tylenol , ibuprofen , pepcid. The treatment provided moderate relief.     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 (Patient taking differently: Take 1 tablet by mouth 2 (two) times daily. Has 6 days left), Disp: 20 tablet, Rfl: 0   brompheniramine-pseudoephedrine -DM 30-2-10 MG/5ML syrup, Take 5 mLs by mouth 4 (four) times daily as needed., Disp: 120 mL, Rfl: 0   chlorhexidine (PERIDEX) 0.12 % solution, 15 mLs by Mouth Rinse route daily., Disp: , Rfl: 1   fluticasone  (FLONASE ) 50 MCG/ACT nasal spray, Place 2 sprays into both nostrils daily., Disp: 16 g, Rfl: 0   ibuprofen  (ADVIL ,MOTRIN ) 200 MG tablet, Take 400 mg by mouth every 6 (six) hours as needed for moderate pain., Disp: , Rfl:    ondansetron  (ZOFRAN -ODT) 4 MG disintegrating tablet, Take 1 tablet (4 mg total) by mouth every 8 (eight) hours as needed., Disp: 20 tablet, Rfl: 0   oxyCODONE -acetaminophen  (PERCOCET) 10-325 MG tablet, Take 1-2 tablets by mouth every 4 (four) hours as needed for pain., Disp: 40 tablet, Rfl: 0  Observations/Objective: Patient is well-developed, well-nourished in no acute distress.  Resting comfortably at home.  Head is normocephalic, atraumatic.  No labored breathing.  Speech is clear and coherent with logical content.  Patient is alert and oriented at baseline.    Assessment and Plan: 1. Upper back pain on left side (Primary) - methylPREDNISolone  (MEDROL  DOSEPAK) 4 MG TBPK tablet; 6 day taper; take as directed on package instructions  Dispense: 21 tablet; Refill: 0 - cyclobenzaprine  (FLEXERIL ) 10 MG tablet; Take 0.5-1 tablets (5-10 mg total) by mouth 3 (three) times daily as needed.  Dispense: 30 tablet; Refill: 0  - Suspect back strain/overuse with spasm - Failed NSAIDs - Add Medrol  dose pack and flexeril  - Avoid NSAIDs (Ibuprofen /Advil /Motrin  or Naproxen/Aleve) while on steroid - Tylenol  if okay for breakthrough pain - Heat to area - Epsom salt soak if  able to get in and out of bath tub safely - Neck exercises and stretches provided via AVS - Seek in person evaluation if worsening or fails to improve with treatment   Follow Up Instructions: I discussed the assessment and treatment plan with the patient. The patient was provided an opportunity to ask questions and all were answered. The patient agreed with the plan and demonstrated an understanding of the instructions.  A copy of instructions were sent to the patient via MyChart unless otherwise noted below.    The patient was advised to call back or seek an in-person evaluation if the symptoms worsen or if the condition fails to improve as anticipated.    Nathan Kelp, PA-C
